# Patient Record
Sex: Male | Born: 1950 | Race: Black or African American | Hispanic: No | Marital: Single | Smoking: Current every day smoker
Health system: Southern US, Community
[De-identification: ages and names within clinical notes are randomized; demographics above are authoritative.]

## PROBLEM LIST (undated history)

## (undated) DIAGNOSIS — B1920 Unspecified viral hepatitis C without hepatic coma: Secondary | ICD-10-CM

## (undated) DIAGNOSIS — G629 Polyneuropathy, unspecified: Secondary | ICD-10-CM

## (undated) DIAGNOSIS — I10 Essential (primary) hypertension: Secondary | ICD-10-CM

## (undated) DIAGNOSIS — K7460 Unspecified cirrhosis of liver: Secondary | ICD-10-CM

## (undated) HISTORY — PX: MANDIBLE SURGERY: SHX707

## (undated) HISTORY — PX: DENTAL SURGERY: SHX609

---

## 2008-10-28 ENCOUNTER — Encounter: Admission: RE | Admit: 2008-10-28 | Discharge: 2008-10-28 | Payer: Self-pay | Admitting: Family Medicine

## 2009-06-08 ENCOUNTER — Encounter: Admission: RE | Admit: 2009-06-08 | Discharge: 2009-06-08 | Payer: Self-pay | Admitting: Family Medicine

## 2011-11-25 ENCOUNTER — Emergency Department (HOSPITAL_COMMUNITY)
Admission: EM | Admit: 2011-11-25 | Discharge: 2011-11-25 | Disposition: A | Discharge status: Home / Self Care | Payer: Medicaid Other | Attending: Emergency Medicine | Admitting: Emergency Medicine

## 2011-11-25 ENCOUNTER — Emergency Department (HOSPITAL_COMMUNITY): Payer: Medicaid Other

## 2011-11-25 ENCOUNTER — Encounter (HOSPITAL_COMMUNITY): Payer: Self-pay

## 2011-11-25 ENCOUNTER — Other Ambulatory Visit: Payer: Self-pay

## 2011-11-25 DIAGNOSIS — Z5900 Homelessness unspecified: Secondary | ICD-10-CM | POA: Insufficient documentation

## 2011-11-25 DIAGNOSIS — Z9889 Other specified postprocedural states: Secondary | ICD-10-CM | POA: Insufficient documentation

## 2011-11-25 DIAGNOSIS — R000 Tachycardia, unspecified: Secondary | ICD-10-CM | POA: Insufficient documentation

## 2011-11-25 DIAGNOSIS — K5900 Constipation, unspecified: Secondary | ICD-10-CM | POA: Insufficient documentation

## 2011-11-25 DIAGNOSIS — R5381 Other malaise: Secondary | ICD-10-CM | POA: Insufficient documentation

## 2011-11-25 DIAGNOSIS — Z79899 Other long term (current) drug therapy: Secondary | ICD-10-CM | POA: Insufficient documentation

## 2011-11-25 DIAGNOSIS — R5383 Other fatigue: Secondary | ICD-10-CM | POA: Insufficient documentation

## 2011-11-25 DIAGNOSIS — E119 Type 2 diabetes mellitus without complications: Secondary | ICD-10-CM | POA: Insufficient documentation

## 2011-11-25 DIAGNOSIS — I10 Essential (primary) hypertension: Secondary | ICD-10-CM | POA: Insufficient documentation

## 2011-11-25 DIAGNOSIS — R42 Dizziness and giddiness: Secondary | ICD-10-CM | POA: Insufficient documentation

## 2011-11-25 DIAGNOSIS — R10819 Abdominal tenderness, unspecified site: Secondary | ICD-10-CM | POA: Insufficient documentation

## 2011-11-25 DIAGNOSIS — K7460 Unspecified cirrhosis of liver: Secondary | ICD-10-CM | POA: Insufficient documentation

## 2011-11-25 DIAGNOSIS — Z8619 Personal history of other infectious and parasitic diseases: Secondary | ICD-10-CM | POA: Insufficient documentation

## 2011-11-25 DIAGNOSIS — R739 Hyperglycemia, unspecified: Secondary | ICD-10-CM

## 2011-11-25 DIAGNOSIS — Z794 Long term (current) use of insulin: Secondary | ICD-10-CM | POA: Insufficient documentation

## 2011-11-25 DIAGNOSIS — F17200 Nicotine dependence, unspecified, uncomplicated: Secondary | ICD-10-CM | POA: Insufficient documentation

## 2011-11-25 DIAGNOSIS — Z7982 Long term (current) use of aspirin: Secondary | ICD-10-CM | POA: Insufficient documentation

## 2011-11-25 HISTORY — DX: Unspecified viral hepatitis C without hepatic coma: B19.20

## 2011-11-25 HISTORY — DX: Unspecified cirrhosis of liver: K74.60

## 2011-11-25 HISTORY — DX: Essential (primary) hypertension: I10

## 2011-11-25 LAB — DIFFERENTIAL
Basophils Absolute: 0 10*3/uL (ref 0.0–0.1)
Eosinophils Relative: 0 % (ref 0–5)
Lymphocytes Relative: 52 % — ABNORMAL HIGH (ref 12–46)
Lymphs Abs: 2 10*3/uL (ref 0.7–4.0)
Monocytes Absolute: 0.2 10*3/uL (ref 0.1–1.0)
Neutrophils Relative %: 42 % — ABNORMAL LOW (ref 43–77)

## 2011-11-25 LAB — CBC
MCHC: 34.6 g/dL (ref 30.0–36.0)
MCV: 89.2 fL (ref 78.0–100.0)
Platelets: 143 10*3/uL — ABNORMAL LOW (ref 150–400)
RBC: 4.89 MIL/uL (ref 4.22–5.81)

## 2011-11-25 LAB — URINALYSIS, ROUTINE W REFLEX MICROSCOPIC
Bilirubin Urine: NEGATIVE
Glucose, UA: >1000 mg/dL — AB
Leukocytes, UA: NEGATIVE
Nitrite: NEGATIVE
Protein, ur: NEGATIVE mg/dL

## 2011-11-25 LAB — GLUCOSE, CAPILLARY
Glucose-Capillary: 431 mg/dL — ABNORMAL HIGH (ref 70–99)
Glucose-Capillary: 490 mg/dL — ABNORMAL HIGH (ref 70–99)

## 2011-11-25 LAB — COMPREHENSIVE METABOLIC PANEL
Albumin: 3.5 g/dL (ref 3.5–5.2)
Chloride: 98 mEq/L (ref 96–112)
GFR calc non Af Amer: >90 mL/min (ref >90)
Glucose, Bld: 472 mg/dL — ABNORMAL HIGH (ref 70–99)
Potassium: 4.3 mEq/L (ref 3.5–5.1)
Total Protein: 7.7 g/dL (ref 6.0–8.3)

## 2011-11-25 MED ORDER — SODIUM CHLORIDE 0.9 % IV BOLUS (SEPSIS)
1000.0000 mL | Freq: Once | INTRAVENOUS | Status: AC
  Administered 2011-11-25: 1000 mL via INTRAVENOUS

## 2011-11-25 MED ORDER — HYDROMORPHONE HCL PF 1 MG/ML IJ SOLN
1.0000 mg | Freq: Once | INTRAMUSCULAR | Status: AC
  Administered 2011-11-25: 1 mg via INTRAVENOUS
  Filled 2011-11-25: qty 1

## 2011-11-25 MED ORDER — TRAMADOL HCL 50 MG PO TABS: 50.0000 mg | ORAL_TABLET | Freq: Four times a day (QID) | ORAL | Status: AC | PRN

## 2011-11-25 MED ORDER — METOCLOPRAMIDE HCL 5 MG/ML IJ SOLN
10.0000 mg | Freq: Once | INTRAMUSCULAR | Status: AC
  Administered 2011-11-25: 10 mg via INTRAVENOUS
  Filled 2011-11-25: qty 2

## 2011-11-25 MED ORDER — IOHEXOL 300 MG/ML  SOLN
100.0000 mL | Freq: Once | INTRAMUSCULAR | Status: AC | PRN
  Administered 2011-11-25: 100 mL via INTRAVENOUS

## 2011-11-25 MED ORDER — IOHEXOL 300 MG/ML  SOLN
40.0000 mL | Freq: Once | INTRAMUSCULAR | Status: AC | PRN
  Administered 2011-11-25: 40 mL via ORAL

## 2011-11-25 MED ORDER — POLYETHYLENE GLYCOL 3350 17 GM/SCOOP PO POWD: 17.0000 g | Freq: Two times a day (BID) | ORAL | Status: AC

## 2011-11-25 MED ORDER — METOCLOPRAMIDE HCL 10 MG PO TABS: 10.0000 mg | ORAL_TABLET | Freq: Four times a day (QID) | ORAL | Status: DC

## 2011-11-25 MED ORDER — ONDANSETRON HCL 4 MG/2ML IJ SOLN
4.0000 mg | Freq: Once | INTRAMUSCULAR | Status: AC
  Administered 2011-11-25: 4 mg via INTRAVENOUS
  Filled 2011-11-25: qty 2

## 2011-11-25 MED ORDER — INSULIN ASPART 100 UNIT/ML ~~LOC~~ SOLN: 15.0000 [IU] | Freq: Three times a day (TID) | SUBCUTANEOUS | Status: DC

## 2011-11-25 MED ORDER — "INSULIN SYRINGE-NEEDLE U-100 26G X 1/2"" 1 ML MISC": 1.0000 | Freq: Three times a day (TID) | Status: DC

## 2011-11-25 MED ORDER — INSULIN ASPART 100 UNIT/ML ~~LOC~~ SOLN
15.0000 [IU] | Freq: Once | SUBCUTANEOUS | Status: AC
  Administered 2011-11-25: 15 [IU] via SUBCUTANEOUS
  Filled 2011-11-25: qty 1

## 2011-11-25 NOTE — ED Notes (Signed)
In to recheck sugar-255. Pt had vomited up contrast in floor. Area cleaned and edp aware

## 2011-11-25 NOTE — ED Provider Notes (Signed)
History     CSN: [id number]  Arrival date & time 11/25/11  1210   First MD Initiated Contact with Patient 11/25/11 1253      Chief Complaint  Patient presents with  . Hyperglycemia    (Consider location/radiation/quality/duration/timing/severity/associated sxs/prior treatment) HPI Comments: 61 year old male history of multiple medical problems including hypertension and diabetes presents with generalized malaise, fatigue and "not feeling well." Patient states he has been homeless and without medication since August. He is not taking any insulin since August. Unsure of what his blood sugar is. His had loose stool with no hematochezia or melena. Nausea but no vomiting. Has been trying to establish care with a primary care physician but has been unsuccessful. He denies chest pain, shortness of breath, palpitations. He has generalized abdominal pain.  The history is provided by the patient. No language interpreter was used.    Past Medical History  Diagnosis Date  . Diabetes mellitus   . HTN (hypertension)   . Cirrhosis of liver   . Hepatitis C     Past Surgical History  Procedure Date  . Mandible surgery     No family history on file.  History  Substance Use Topics  . Smoking status: Current Everyday Smoker  . Smokeless tobacco: Not on file  . Alcohol Use: 1.8 oz/week    3 Cans of beer per week      Review of Systems  Constitutional: Negative for fever, chills and fatigue.  HENT: Negative for congestion, sore throat, rhinorrhea, neck pain and neck stiffness.   Eyes: Negative for photophobia and visual disturbance.  Respiratory: Negative for cough and shortness of breath.   Cardiovascular: Negative for chest pain and palpitations.  Gastrointestinal: Negative for nausea, vomiting and abdominal pain.  Genitourinary: Negative for dysuria, urgency, frequency and flank pain.  Musculoskeletal: Negative for myalgias, back pain and arthralgias.  Neurological: Positive for  dizziness and weakness (generalized). Negative for light-headedness, numbness and headaches.  All other systems reviewed and are negative.    Allergies  Review of patient's allergies indicates no known allergies.  Home Medications   Current Outpatient Rx  Name Route Sig Dispense Refill  . ASPIRIN 325 MG PO TABS Oral Take 325 mg by mouth daily as needed. For pain    . INSULIN ASPART 100 UNIT/ML [place] SOLN Subcutaneous Inject 15 Units into the skin 3 (three) times daily before meals. 1 vial 12  . INSULIN SYRINGE-NEEDLE U-100 26G X 1/2" 1 ML MISC Does not apply 1 applicator by Does not apply route 3 (three) times daily. 1 each 1  . METOCLOPRAMIDE HCL 10 MG PO TABS Oral Take 1 tablet (10 mg total) by mouth every 6 (six) hours. 30 tablet 1  . POLYETHYLENE GLYCOL 3350 PO POWD Oral Take 17 g by mouth 2 (two) times daily. 255 g 0  . TRAMADOL HCL 50 MG PO TABS Oral Take 1 tablet (50 mg total) by mouth every 6 (six) hours as needed for pain. 15 tablet 0    BP 178/97  Pulse 76  Temp(Src) 97.8 F (36.6 C) (Oral)  Resp 20  Ht 5\' 11"  (1.803 m)  Wt 170 lb (77.111 kg)  BMI 23.71 kg/m2  SpO2 98%  Physical Exam  Nursing note and vitals reviewed. Constitutional: He is oriented to person, place, and time. He appears well-developed and well-nourished. No distress.  HENT:  Head: Normocephalic and atraumatic.  Mouth/Throat: Oropharynx is clear and moist.  Eyes: Conjunctivae and EOM are normal. Pupils are equal, round,  and reactive to light.  Neck: Normal range of motion. Neck supple.  Cardiovascular: Regular rhythm, normal heart sounds and intact distal pulses.  Exam reveals no gallop and no friction rub.   No murmur heard.      Tachycardic rate  Pulmonary/Chest: Effort normal and breath sounds normal. No respiratory distress. He exhibits no tenderness.  Abdominal: Soft. Bowel sounds are normal. There is tenderness (diffuse). There is no rebound and no guarding.  Musculoskeletal: Normal range of  motion. He exhibits no edema and no tenderness.  Neurological: He is alert and oriented to person, place, and time. No cranial nerve deficit.  Skin: Skin is warm and dry. No rash noted.    ED Course  Procedures (including critical care time)   Date: 11/25/2011  Rate: 74  Rhythm: normal sinus rhythm  QRS Axis: normal  Intervals: normal  ST/T Wave abnormalities: normal  Conduction Disutrbances:none  Narrative Interpretation:   Old EKG Reviewed: none available  Labs Reviewed  GLUCOSE, CAPILLARY - Abnormal; Notable for the following:    Glucose-Capillary 490 (*)    All other components within normal limits  GLUCOSE, CAPILLARY - Abnormal; Notable for the following:    Glucose-Capillary 431 (*)    All other components within normal limits  CBC - Abnormal; Notable for the following:    WBC 3.9 (*)    Platelets 143 (*)    All other components within normal limits  DIFFERENTIAL - Abnormal; Notable for the following:    Neutrophils Relative 42 (*)    Neutro Abs 1.6 (*)    Lymphocytes Relative 52 (*)    All other components within normal limits  COMPREHENSIVE METABOLIC PANEL - Abnormal; Notable for the following:    Glucose, Bld 472 (*)    AST 147 (*)    ALT 110 (*)    All other components within normal limits  URINALYSIS, ROUTINE W REFLEX MICROSCOPIC - Abnormal; Notable for the following:    Specific Gravity, Urine <1.005 (*)    Glucose, UA >1000 (*)    All other components within normal limits  GLUCOSE, CAPILLARY - Abnormal; Notable for the following:    Glucose-Capillary 219 (*)    All other components within normal limits  AMMONIA  URINE MICROSCOPIC-ADD ON   Dg Chest 2 View  11/25/2011  *RADIOLOGY REPORT*  Clinical Data: Malaise with hypertension.  Diabetes cirrhosis and hepatitis C  CHEST - 2 VIEW  Comparison: None.  Findings: Heart and mediastinal contours are within normal limits. The lung fields appear clear with no signs of focal infiltrate or congestive failure.  No  pleural fluid or significant peribronchial cuffing is seen.  Bony structures appear intact.  IMPRESSION: No worrisome focal or acute cardiopulmonary abnormality noted.  Original Report Authenticated By: Bertha Stakes, M.D.   Ct Abdomen Pelvis W Contrast  11/25/2011  *RADIOLOGY REPORT*  Clinical Data: Diffuse abdominal pain.  Hypertension.  Cirrhosis. Hepatitis C.  CT ABDOMEN AND PELVIS WITH CONTRAST  Technique:  Multidetector CT imaging of the abdomen and pelvis was performed following the standard protocol during bolus administration of intravenous contrast.  Contrast: OMNIPAQUE IOHEXOL 300 MG/ML IJ SOLN  Comparison: 06/08/2009  Findings: Left base scarring.  Normal heart size without pericardial or pleural effusion.  Mild esophageal dilatation with contrast within.  Example image 1.  Moderate hepatic steatosis.  There is also subtle cirrhosis, with enlargement of the caudate lobe and atrophy of the medial segment left lobe.  No focal liver lesion.  Portal veins  and hepatic veins are patent.  Splenule.  Stomach is contrast filled and mildly prominent.  The descending duodenum is normal to decompressed in caliber.  No obstructive process is seen.  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 Diffuse main pancreatic duct dilatation with pancreatic atrophy. Duct measures up to 1.7 cm in the region of the neck, unchanged. There are confluent focal calcifications in the head, uncinate process, which are unchanged.  No definite peripancreatic edema.  Numerous small gallstones without gallbladder wall thickening or pericholecystic fluid.  No biliary ductal dilatation.  Normal adrenal glands.  Right renal collecting systems stones. Normal left kidney.  Aortic atherosclerosis. No retroperitoneal or retrocrural adenopathy.  Colonic stool burden suggests constipation.  The proximal sigmoid appears thick-walled on image 69.  The rectosigmoid junction appears thick-walled on image 65.  These  areas may be both secondary to underdistension.  Normal terminal ileum and appendix.  Normal small bowel without abdominal ascites.    No pelvic adenopathy.     No evidence of portal venous hypertension.  No pelvic adenopathy.    Normal urinary bladder and prostate.  No significant free fluid.  No acute osseous abnormality.  Prominent disc bulge including at L2-L3.  IMPRESSION:  1.  Mild cirrhosis, without evidence of hepatocellular carcinoma or portal venous hypertension. 2.  Findings of chronic calcific pancreatitis, similar to on the prior.  No convincing evidence of acute superimposed pancreatitis. 3.  Cholelithiasis. 4. Esophageal air fluid level suggests dysmotility or gastroesophageal reflux. 5.  Apparent mild gastric distention, with relative normal caliber of the duodenum.  No definite obstructive mass is identified.  If gastric outlet obstruction is a clinical concern, non emergent upper GI or endoscopy should be considered. 6. Possible constipation. 7.  Areas of apparent wall thickening within the sigmoid and rectosigmoid colon.  Possibly secondary to underdistension.  If this 61 year old patient has not undergone a screening colonoscopy recently, this should be considered as an outpatient. 8.  Right renal calculi.  Original Report Authenticated By: Consuello Bossier, M.D.     1. Hyperglycemia   2. Constipation       MDM  Hyperglycemia and constipation. Also has mild cirrhosis which he was aware. He's not been taking any of his medications for the past several months. He is homeless. He does have insurance. He received 2 L of fluids, subcutaneous insulin. The remainder of his workup was relatively unremarkable except for slight elevation of liver enzymes consistent with a cirrhosis. He had improvement of his blood sugar after the interventions. He states he is feeling better. He was given Reglan for some gastroparesis seen on CT. He is prescribed Reglan, insulin, MiraLAX for constipation.  Instructed to followup with her primary care physician.        Dayton Bailiff, MD 11/25/11 [phone]

## 2011-11-25 NOTE — Discharge Instructions (Signed)
Hyperglycemia  Hyperglycemia occurs when the glucose (sugar) in your blood is too high. Hyperglycemia can happen for many reasons, but it most often happens to people who do not know they have diabetes or are not managing their diabetes properly.   CAUSES   Whether you have diabetes or not, there are other causes of hyperglycemia. Hyperglycemia can occur when you have diabetes, but it can also occur in other situations that you might not be as aware of, such as:  Diabetes  · If you have diabetes and are having problems controlling your blood glucose, hyperglycemia could occur because of some of the following reasons:  · Not following your meal plan.  · Not taking your diabetes medications or not taking it properly.  · Exercising less or doing less activity than you normally do.  · Being sick.  Pre-diabetes  · This cannot be ignored. Before people develop Type 2 diabetes, they almost always have "pre-diabetes." This is when your blood glucose levels are higher than normal, but not yet high enough to be diagnosed as diabetes. Research has shown that some long-term damage to the body, especially the heart and circulatory system, may already be occurring during pre-diabetes. If you take action to manage your blood glucose when you have pre-diabetes, you may delay or prevent Type 2 diabetes from developing.  Stress  · If you have diabetes, you may be "diet" controlled or on oral medications or insulin to control your diabetes. However, you may find that your blood glucose is higher than usual in the hospital whether you have diabetes or not. This is often referred to as "stress hyperglycemia." Stress can elevate your blood glucose. This happens because of hormones put out by the body during times of stress. If stress has been the cause of your high blood glucose, it can be followed regularly by your caregiver. That way he/she can make sure your hyperglycemia does not continue to get worse or progress to  diabetes.  Steroids  · Steroids are medications that act on the infection fighting system (immune system) to block inflammation or infection. One side effect can be a rise in blood glucose. Most people can produce enough extra insulin to allow for this rise, but for those who cannot, steroids make blood glucose levels go even higher. It is not unusual for steroid treatments to "uncover" diabetes that is developing. It is not always possible to determine if the hyperglycemia will go away after the steroids are stopped. A special blood test called an A1c is sometimes done to determine if your blood glucose was elevated before the steroids were started.  SYMPTOMS  · Thirsty.  · Frequent urination.  · Dry mouth.  · Blurred vision.  · Tired or fatigue.  · Weakness.  · Sleepy.  · Tingling in feet or leg.  DIAGNOSIS   Diagnosis is made by monitoring blood glucose in one or all of the following ways:  · A1c test. This is a chemical found in your blood.  · Fingerstick blood glucose monitoring.  · Laboratory results.  TREATMENT   First, knowing the cause of the hyperglycemia is important before the hyperglycemia can be treated. Treatment may include, but is not be limited to:  · Education.  · Change or adjustment in medications.  · Change or adjustment in meal plan.  · Treatment for an illness, infection, etc.  · More frequent blood glucose monitoring.  · Change in exercise plan.  · Decreasing or stopping steroids.  ·   Lifestyle changes.  HOME CARE INSTRUCTIONS   · Test your blood glucose as directed.  · Exercise regularly. Your caregiver will give you instructions about exercise. Pre-diabetes or diabetes which comes on with stress is helped by exercising.  · Eat wholesome, balanced meals. Eat often and at regular, fixed times. Your caregiver or nutritionist will give you a meal plan to guide your sugar intake.  · Being at an ideal weight is important. If needed, losing as little as 10 to 15 pounds may help improve blood  glucose levels.  SEEK MEDICAL CARE IF:   · You have questions about medicine, activity, or diet.  · You continue to have symptoms (problems such as increased thirst, urination, or weight gain).  SEEK IMMEDIATE MEDICAL CARE IF:   · You are vomiting or have diarrhea.  · Your breath smells fruity.  · You are breathing faster or slower.  · You are very sleepy or incoherent.  · You have numbness, tingling, or pain in your feet or hands.  · You have chest pain.  · Your symptoms get worse even though you have been following your caregiver's orders.  · If you have any other questions or concerns.  Document Released: 02/08/2001 Document Revised: 08/04/2011 Document Reviewed: 04/06/2009  [employer]® Patient Information ©2012 [employer].

## 2011-11-25 NOTE — ED Notes (Signed)
Pt c/o high blood sugar since not having insulin since August per pt. C/o abd pain.

## 2011-11-28 LAB — GLUCOSE, CAPILLARY: Glucose-Capillary: 255 mg/dL — ABNORMAL HIGH (ref 70–99)

## 2012-03-21 DIAGNOSIS — I10 Essential (primary) hypertension: Secondary | ICD-10-CM

## 2013-02-25 ENCOUNTER — Emergency Department: Payer: Self-pay | Admitting: Emergency Medicine

## 2013-02-25 LAB — COMPREHENSIVE METABOLIC PANEL
Alkaline Phosphatase: 129 U/L (ref 50–136)
Bilirubin,Total: 0.5 mg/dL (ref 0.2–1.0)
Calcium, Total: 8.7 mg/dL (ref 8.5–10.1)
Chloride: 104 mmol/L (ref 98–107)
Co2: 30 mmol/L (ref 21–32)
EGFR (African American): >60
EGFR (Non-African Amer.): >60
Osmolality: 283 (ref 275–301)
SGOT(AST): 111 U/L — ABNORMAL HIGH (ref 15–37)
SGPT (ALT): 193 U/L — ABNORMAL HIGH (ref 12–78)
Total Protein: 7.1 g/dL (ref 6.4–8.2)

## 2013-02-25 LAB — TROPONIN I: Troponin-I: <0.02 ng/mL

## 2013-02-25 LAB — CBC
MCHC: 33.4 g/dL (ref 32.0–36.0)
Platelet: 132 10*3/uL — ABNORMAL LOW (ref 150–440)

## 2013-02-25 LAB — PROTIME-INR
INR: 1
Prothrombin Time: 13.7 secs (ref 11.5–14.7)

## 2014-05-07 ENCOUNTER — Emergency Department: Payer: Self-pay | Admitting: Emergency Medicine

## 2014-05-07 LAB — URINALYSIS, COMPLETE
BLOOD: NEGATIVE
Bacteria: NONE SEEN
Bilirubin,UR: NEGATIVE
Glucose,UR: >=500 mg/dL (ref 0–75)
Ketone: NEGATIVE
LEUKOCYTE ESTERASE: NEGATIVE
NITRITE: NEGATIVE
PH: 5 (ref 4.5–8.0)
PROTEIN: NEGATIVE
RBC,UR: NONE SEEN /HPF (ref 0–5)
Specific Gravity: 1.002 (ref 1.003–1.030)

## 2014-05-07 LAB — CBC WITH DIFFERENTIAL/PLATELET
Basophil #: 0 10*3/uL (ref 0.0–0.1)
Basophil %: 0.6 %
Eosinophil #: 0 10*3/uL (ref 0.0–0.7)
Eosinophil %: 0.4 %
HCT: 40.6 % (ref 40.0–52.0)
HGB: 13.1 g/dL (ref 13.0–18.0)
LYMPHS ABS: 2.3 10*3/uL (ref 1.0–3.6)
Lymphocyte %: 56.4 %
MCH: 31.7 pg (ref 26.0–34.0)
MCHC: 32.3 g/dL (ref 32.0–36.0)
MCV: 98 fL (ref 80–100)
Monocyte #: 0.2 x10 3/mm (ref 0.2–1.0)
Monocyte %: 5.3 %
NEUTROS ABS: 1.5 10*3/uL (ref 1.4–6.5)
NEUTROS PCT: 37.3 %
Platelet: 104 10*3/uL — ABNORMAL LOW (ref 150–440)
RBC: 4.13 10*6/uL — AB (ref 4.40–5.90)
RDW: 15.1 % — AB (ref 11.5–14.5)
WBC: 4 10*3/uL (ref 3.8–10.6)

## 2014-05-07 LAB — COMPREHENSIVE METABOLIC PANEL
ALT: 210 U/L — AB
ANION GAP: 10 (ref 7–16)
Albumin: 2.9 g/dL — ABNORMAL LOW (ref 3.4–5.0)
Alkaline Phosphatase: 114 U/L
BILIRUBIN TOTAL: 0.7 mg/dL (ref 0.2–1.0)
BUN: 5 mg/dL — ABNORMAL LOW (ref 7–18)
CALCIUM: 8.4 mg/dL — AB (ref 8.5–10.1)
CHLORIDE: 100 mmol/L (ref 98–107)
Co2: 25 mmol/L (ref 21–32)
Creatinine: 0.77 mg/dL (ref 0.60–1.30)
GLUCOSE: 260 mg/dL — AB (ref 65–99)
Osmolality: 276 (ref 275–301)
POTASSIUM: 3.9 mmol/L (ref 3.5–5.1)
SGOT(AST): 220 U/L — ABNORMAL HIGH (ref 15–37)
SODIUM: 135 mmol/L — AB (ref 136–145)
Total Protein: 7 g/dL (ref 6.4–8.2)

## 2014-05-07 LAB — PROTIME-INR
INR: 1.1
PROTHROMBIN TIME: 14.1 s (ref 11.5–14.7)

## 2014-05-07 LAB — LIPASE, BLOOD: Lipase: 45 U/L — ABNORMAL LOW (ref 73–393)

## 2014-06-19 ENCOUNTER — Ambulatory Visit: Payer: Self-pay | Admitting: Gastroenterology

## 2014-06-23 ENCOUNTER — Ambulatory Visit: Payer: Self-pay | Admitting: Gastroenterology

## 2014-06-23 ENCOUNTER — Ambulatory Visit: Payer: Self-pay | Admitting: Neurology

## 2014-08-06 ENCOUNTER — Emergency Department: Payer: Self-pay | Admitting: Emergency Medicine

## 2014-08-06 LAB — COMPREHENSIVE METABOLIC PANEL
ANION GAP: 5 — AB (ref 7–16)
Albumin: 2.9 g/dL — ABNORMAL LOW (ref 3.4–5.0)
Alkaline Phosphatase: 139 U/L — ABNORMAL HIGH
BUN: 7 mg/dL (ref 7–18)
Bilirubin,Total: 1 mg/dL (ref 0.2–1.0)
CALCIUM: 8.1 mg/dL — AB (ref 8.5–10.1)
CREATININE: 1.07 mg/dL (ref 0.60–1.30)
Chloride: 97 mmol/L — ABNORMAL LOW (ref 98–107)
Co2: 31 mmol/L (ref 21–32)
EGFR (Non-African Amer.): >60
GLUCOSE: 445 mg/dL — AB (ref 65–99)
Osmolality: 284 (ref 275–301)
Potassium: 3.6 mmol/L (ref 3.5–5.1)
SGOT(AST): 86 U/L — ABNORMAL HIGH (ref 15–37)
SGPT (ALT): 92 U/L — ABNORMAL HIGH
Sodium: 133 mmol/L — ABNORMAL LOW (ref 136–145)
TOTAL PROTEIN: 7.2 g/dL (ref 6.4–8.2)

## 2014-08-06 LAB — URINALYSIS, COMPLETE
Bacteria: NONE SEEN
Bilirubin,UR: NEGATIVE
Blood: NEGATIVE
Glucose,UR: >=500 mg/dL (ref 0–75)
Ketone: NEGATIVE
Leukocyte Esterase: NEGATIVE
Nitrite: NEGATIVE
PROTEIN: NEGATIVE
Ph: 6 (ref 4.5–8.0)
RBC,UR: 1 /HPF (ref 0–5)
SPECIFIC GRAVITY: 1.021 (ref 1.003–1.030)
Squamous Epithelial: <1
WBC UR: NONE SEEN /HPF (ref 0–5)

## 2014-08-06 LAB — CBC
HCT: 38.6 % — ABNORMAL LOW (ref 40.0–52.0)
HGB: 12.6 g/dL — ABNORMAL LOW (ref 13.0–18.0)
MCH: 32.6 pg (ref 26.0–34.0)
MCHC: 32.7 g/dL (ref 32.0–36.0)
MCV: 100 fL (ref 80–100)
PLATELETS: 88 10*3/uL — AB (ref 150–440)
RBC: 3.87 10*6/uL — ABNORMAL LOW (ref 4.40–5.90)
RDW: 14.2 % (ref 11.5–14.5)
WBC: 3.9 10*3/uL (ref 3.8–10.6)

## 2014-08-06 LAB — LIPASE, BLOOD: LIPASE: 40 U/L — AB (ref 73–393)

## 2014-09-23 ENCOUNTER — Ambulatory Visit: Payer: Self-pay | Admitting: Podiatry

## 2014-09-30 ENCOUNTER — Ambulatory Visit: Payer: Self-pay | Admitting: Podiatry

## 2014-10-16 ENCOUNTER — Ambulatory Visit: Payer: Self-pay | Admitting: Podiatry

## 2014-12-22 LAB — SURGICAL PATHOLOGY

## 2015-02-05 ENCOUNTER — Emergency Department
Admission: EM | Admit: 2015-02-05 | Discharge: 2015-02-05 | Disposition: A | Discharge status: Home / Self Care | Payer: Medicaid Other | Attending: Emergency Medicine | Admitting: Emergency Medicine

## 2015-02-05 ENCOUNTER — Other Ambulatory Visit: Payer: Self-pay

## 2015-02-05 ENCOUNTER — Encounter: Payer: Self-pay | Admitting: Emergency Medicine

## 2015-02-05 DIAGNOSIS — R739 Hyperglycemia, unspecified: Secondary | ICD-10-CM

## 2015-02-05 DIAGNOSIS — I10 Essential (primary) hypertension: Secondary | ICD-10-CM | POA: Diagnosis present

## 2015-02-05 DIAGNOSIS — Z794 Long term (current) use of insulin: Secondary | ICD-10-CM | POA: Diagnosis not present

## 2015-02-05 DIAGNOSIS — M79604 Pain in right leg: Secondary | ICD-10-CM | POA: Diagnosis not present

## 2015-02-05 DIAGNOSIS — M79605 Pain in left leg: Secondary | ICD-10-CM | POA: Diagnosis not present

## 2015-02-05 DIAGNOSIS — Z720 Tobacco use: Secondary | ICD-10-CM | POA: Insufficient documentation

## 2015-02-05 DIAGNOSIS — E1165 Type 2 diabetes mellitus with hyperglycemia: Secondary | ICD-10-CM | POA: Insufficient documentation

## 2015-02-05 DIAGNOSIS — I159 Secondary hypertension, unspecified: Secondary | ICD-10-CM | POA: Insufficient documentation

## 2015-02-05 HISTORY — DX: Polyneuropathy, unspecified: G62.9

## 2015-02-05 LAB — CBC WITH DIFFERENTIAL/PLATELET
BASOS ABS: 0.1 10*3/uL (ref 0–0.1)
Basophils Relative: 2 %
EOS ABS: 0 10*3/uL (ref 0–0.7)
Eosinophils Relative: 0 %
HEMATOCRIT: 43.4 % (ref 40.0–52.0)
HEMOGLOBIN: 14.2 g/dL (ref 13.0–18.0)
LYMPHS ABS: 1.5 10*3/uL (ref 1.0–3.6)
LYMPHS PCT: 36 %
MCH: 31.9 pg (ref 26.0–34.0)
MCHC: 32.7 g/dL (ref 32.0–36.0)
MCV: 97.4 fL (ref 80.0–100.0)
Monocytes Absolute: 0.3 10*3/uL (ref 0.2–1.0)
Monocytes Relative: 6 %
NEUTROS ABS: 2.3 10*3/uL (ref 1.4–6.5)
NEUTROS PCT: 56 %
PLATELETS: 102 10*3/uL — AB (ref 150–440)
RBC: 4.45 MIL/uL (ref 4.40–5.90)
RDW: 12.8 % (ref 11.5–14.5)
WBC: 4.1 10*3/uL (ref 3.8–10.6)

## 2015-02-05 LAB — URINALYSIS COMPLETE WITH MICROSCOPIC (ARMC ONLY)
BILIRUBIN URINE: NEGATIVE
Bacteria, UA: NONE SEEN
HGB URINE DIPSTICK: NEGATIVE
KETONES UR: NEGATIVE mg/dL
LEUKOCYTES UA: NEGATIVE
NITRITE: NEGATIVE
PROTEIN: NEGATIVE mg/dL
SPECIFIC GRAVITY, URINE: 1.028 (ref 1.005–1.030)
WBC UA: NONE SEEN WBC/hpf (ref 0–5)
pH: 6 (ref 5.0–8.0)

## 2015-02-05 LAB — COMPREHENSIVE METABOLIC PANEL
ALK PHOS: 120 U/L (ref 38–126)
ALT: 137 U/L — AB (ref 17–63)
ANION GAP: 7 (ref 5–15)
AST: 108 U/L — AB (ref 15–41)
Albumin: 4 g/dL (ref 3.5–5.0)
BILIRUBIN TOTAL: 1.1 mg/dL (ref 0.3–1.2)
BUN: 7 mg/dL (ref 6–20)
CO2: 30 mmol/L (ref 22–32)
Calcium: 9.2 mg/dL (ref 8.9–10.3)
Chloride: 97 mmol/L — ABNORMAL LOW (ref 101–111)
Creatinine, Ser: 1.09 mg/dL (ref 0.61–1.24)
GFR calc non Af Amer: >60 mL/min (ref >60)
GLUCOSE: 536 mg/dL — AB (ref 65–99)
POTASSIUM: 4.4 mmol/L (ref 3.5–5.1)
SODIUM: 134 mmol/L — AB (ref 135–145)
Total Protein: 8.1 g/dL (ref 6.5–8.1)

## 2015-02-05 LAB — GLUCOSE, CAPILLARY
GLUCOSE-CAPILLARY: 520 mg/dL — AB (ref 65–99)
GLUCOSE-CAPILLARY: 372 mg/dL — AB (ref 65–99)
Glucose-Capillary: 522 mg/dL — ABNORMAL HIGH (ref 65–99)

## 2015-02-05 LAB — TROPONIN I

## 2015-02-05 MED ORDER — INSULIN ASPART 100 UNIT/ML ~~LOC~~ SOLN
6.0000 [IU] | Freq: Once | SUBCUTANEOUS | Status: AC
  Administered 2015-02-05: 6 [IU] via INTRAVENOUS

## 2015-02-05 MED ORDER — INSULIN ASPART 100 UNIT/ML ~~LOC~~ SOLN
SUBCUTANEOUS | Status: AC
Start: 2015-02-05 — End: 2015-02-05
  Administered 2015-02-05: 6 [IU] via INTRAVENOUS
  Filled 2015-02-05: qty 6

## 2015-02-05 MED ORDER — IBUPROFEN 600 MG PO TABS: 600.0000 mg | ORAL_TABLET | Freq: Once | ORAL | Status: DC

## 2015-02-05 MED ORDER — SODIUM CHLORIDE 0.9 % IV BOLUS (SEPSIS)
1000.0000 mL | Freq: Once | INTRAVENOUS | Status: AC
  Administered 2015-02-05: 1000 mL via INTRAVENOUS

## 2015-02-05 MED ORDER — IBUPROFEN 600 MG PO TABS
ORAL_TABLET | ORAL | Status: AC
  Filled 2015-02-05: qty 1

## 2015-02-05 NOTE — ED Provider Notes (Signed)
[facility] Emergency Department Provider Note    ____________________________________________  Time seen: 1200  I have reviewed the triage vital signs and the nursing notes.   HISTORY  Chief Complaint Hyperglycemia; Hypertension; Abdominal Pain; and Foot Pain   History limited by: Not Limited   HPI Ian [name] is a 64 y.o. male who presents to the emergency department today because of concerns for high blood sugar, high blood pressure and leg pain. Patient states the symptoms been going on for quite a while. It appears that he means weeks perhaps months. He states he has not been taking his insulin. He states this because of financial reasons. His leg pain has also been severe. They state that the primary care doctor for medication which did not seem to be helping. He denies any fevers, persistent vomiting and diarrhea.  Past Medical History  Diagnosis Date  . Diabetes mellitus   . HTN (hypertension)   . Cirrhosis of liver   . Hepatitis C   . Neuropathy   . Cirrhosis   . Hepatitis C     There are no active problems to display for this patient.   Past Surgical History  Procedure Laterality Date  . Mandible surgery    . Dental surgery      teeth extraction    Current Outpatient Rx  Name  Route  Sig  Dispense  Refill  . aspirin 325 MG tablet   Oral   Take 325 mg by mouth daily as needed. For pain         . EXPIRED: insulin aspart (NOVOLOG) 100 UNIT/ML injection   Subcutaneous   Inject 15 Units into the skin 3 (three) times daily before meals.   1 vial   12   . Insulin Syringe-Needle U-100 26G X 1/2" 1 ML MISC   Does not apply   1 applicator by Does not apply route 3 (three) times daily.   1 each   1   . EXPIRED: metoCLOPramide (REGLAN) 10 MG tablet   Oral   Take 1 tablet (10 mg total) by mouth every 6 (six) hours.   30 tablet   1     Allergies Review of patient's allergies indicates no known allergies.  No family  history on file.  Social History History  Substance Use Topics  . Smoking status: Current Every Day Smoker    Types: Cigarettes  . Smokeless tobacco: Never Used  . Alcohol Use: 1.8 oz/week    3 Cans of beer per week    Review of Systems  Constitutional: Negative for fever. Cardiovascular: Negative for chest pain. Respiratory: Negative for shortness of breath. Gastrointestinal: Abdominal pain Genitourinary: Negative for dysuria. Musculoskeletal: Bilateral lower extremity pain. Skin: Negative for rash. Neurological: Negative for headaches, focal weakness or numbness.   10-point ROS otherwise negative.  ____________________________________________   PHYSICAL EXAM:  VITAL SIGNS: ED Triage Vitals  Enc Vitals Group     BP 02/05/15 1034 162/101 mmHg     Pulse Rate 02/05/15 1034 70     Resp 02/05/15 1034 18     Temp 02/05/15 1034 97.7 F (36.5 C)     Temp Source 02/05/15 1034 Oral     SpO2 02/05/15 1034 100 %     Weight 02/05/15 1034 100 lb (45.36 kg)     Height 02/05/15 1034 5\' 10"  (1.778 m)     Head Cir --      Peak Flow --      Pain  Score 02/05/15 1034 10   Constitutional: Alert and oriented. Well appearing and in no distress. Eyes: Conjunctivae are normal. PERRL. Normal extraocular movements. ENT   Head: Normocephalic and atraumatic.   Nose: No congestion/rhinnorhea.   Mouth/Throat: Mucous membranes are moist.   Neck: No stridor. Hematological/Lymphatic/Immunilogical: No cervical lymphadenopathy. Cardiovascular: Normal rate, regular rhythm.  No murmurs, rubs, or gallops. Respiratory: Normal respiratory effort without tachypnea nor retractions. Breath sounds are clear and equal bilaterally. No wheezes/rales/rhonchi. Gastrointestinal: Soft and nontender. No distention.  Genitourinary: Deferred Musculoskeletal: Normal range of motion in all extremities. No joint effusions.  No lower extremity tenderness nor edema. Neurologic:  Normal speech and  language. No gross focal neurologic deficits are appreciated. Speech is normal.  Skin:  Skin is warm, dry and intact. No rash noted. Psychiatric: Mood and affect are normal. Speech and behavior are normal. Patient exhibits appropriate insight and judgment.  ____________________________________________    LABS (pertinent positives/negatives)  Labs Reviewed  CBC WITH DIFFERENTIAL/PLATELET - Abnormal; Notable for the following:    Platelets 102 (*)    All other components within normal limits  COMPREHENSIVE METABOLIC PANEL - Abnormal; Notable for the following:    Sodium 134 (*)    Chloride 97 (*)    Glucose, Bld 536 (*)    AST 108 (*)    ALT 137 (*)    All other components within normal limits  URINALYSIS COMPLETEWITH MICROSCOPIC ([facility] ONLY) - Abnormal; Notable for the following:    Color, Urine STRAW (*)    APPearance CLEAR (*)    Glucose, UA >500 (*)    Squamous Epithelial / LPF 0-5 (*)    All other components within normal limits  GLUCOSE, CAPILLARY - Abnormal; Notable for the following:    Glucose-Capillary 520 (*)    All other components within normal limits  GLUCOSE, CAPILLARY - Abnormal; Notable for the following:    Glucose-Capillary 522 (*)    All other components within normal limits  GLUCOSE, CAPILLARY - Abnormal; Notable for the following:    Glucose-Capillary 372 (*)    All other components within normal limits  TROPONIN I     ____________________________________________   EKG  None  ____________________________________________    RADIOLOGY  None  ____________________________________________   PROCEDURES  Procedure(s) performed: None  Critical Care performed: No  ____________________________________________   INITIAL IMPRESSION / ASSESSMENT AND PLAN / ED COURSE  Pertinent labs & imaging results that were available during my care of the patient were reviewed by me and considered in my medical decision making (see chart for  details).  Patient presents with high blood pressure, hyperglycemia, bilateral leg pain as his primary complaints. On exam patient in no acute distress. Patient did have initially quite elevated blood sugar however no signs of DKA. Patient's blood sugar did get better after insulin and IV fluids. Discussed with patient and family importance that he does get his insulin and take it regularly. Discussed that with better glycemic control his other symptoms will likely improve as well. Also encourage primary care follow-up to tackle high blood sugar and hypertension issues. Patient family verbalized understanding.  ____________________________________________   FINAL CLINICAL IMPRESSION(S) / ED DIAGNOSES  Final diagnoses:  Hyperglycemia  Secondary hypertension, unspecified     Phineas Semen, MD 02/05/15 1523

## 2015-02-05 NOTE — Care Management Note (Signed)
Case Management Note  Patient Details  Name: Ian [name] MRN: [id number] Date of Birth: 1950/10/07  Subjective/Objective:    Per Dr Derrill Kay the pt. Is unable to afford his insulin, but he is listed as having Medicaid. In checking the portal,  He last got insulin in January. Contacted  Scharlene Gloss at [facility] to see if there is any help they can provide for the pt.               Action/Plan:   Expected Discharge Date:                  Expected Discharge Plan:     In-House Referral:     Discharge planning Services     Post Acute Care Choice:    Choice offered to:     DME Arranged:    DME Agency:     HH Arranged:    HH Agency:     Status of Service:     Medicare Important Message Given:    Date Medicare IM Given:    Medicare IM give by:    Date Additional Medicare IM Given:    Additional Medicare Important Message give by:     If discussed at Long Length of Stay Meetings, dates discussed:    Additional Comments:  Berna Bue, RN 02/05/2015, 12:48 PM

## 2015-02-05 NOTE — ED Notes (Signed)
Pt reports not taking medications

## 2015-02-05 NOTE — Care Management Note (Signed)
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 The pt. Should be able to get his meds once he pays the $3 co-pay.                  Action/Plan:   Expected Discharge Date:                  Expected Discharge Plan:     In-House Referral:     Discharge planning Services     Post Acute Care Choice:    Choice offered to:     DME Arranged:    DME Agency:     HH Arranged:    HH Agency:     Status of Service:     Medicare Important Message Given:    Date Medicare IM Given:    Medicare IM give by:    Date Additional Medicare IM Given:    Additional Medicare Important Message give by:     If discussed at Long Length of Stay Meetings, dates discussed:    Additional Comments:  Berna Bue, RN 02/05/2015, 12:56 PM

## 2015-02-05 NOTE — ED Notes (Signed)
Pt states he feels his bp and blood glucose are  high, has not been taking his medicine, having bilateral foot pain, and abd pain. Has hep c and has "issues with his liver" as well.

## 2015-02-05 NOTE — ED Notes (Signed)
CRITICAL VALUE ALERT  Critical value received:  552 glucose  Date of notification:  02/05/2015  Time of notification:  1210  Critical value read back:Yes.    Nurse who received alert:  Drinda Butts RN  MD notified (1st page):  yes  Time of first page:  1210  MD notified (2nd page):  Time of second page:  Responding MD:  ER MD  Time MD responded:  1210

## 2015-02-05 NOTE — Discharge Instructions (Signed)
Please seek medical attention for any high fevers, chest pain, shortness of breath, change in behavior, persistent vomiting, bloody stool or any other new or concerning symptoms. ° °Hyperglycemia °Hyperglycemia occurs when the glucose (sugar) in your blood is too high. Hyperglycemia can happen for many reasons, but it most often happens to people who do not know they have diabetes or are not managing their diabetes properly.  °CAUSES  °Whether you have diabetes or not, there are other causes of hyperglycemia. Hyperglycemia can occur when you have diabetes, but it can also occur in other situations that you might not be as aware of, such as: °Diabetes °· If you have diabetes and are having problems controlling your blood glucose, hyperglycemia could occur because of some of the following reasons: °¨ Not following your meal plan. °¨ Not taking your diabetes medications or not taking it properly. °¨ Exercising less or doing less activity than you normally do. °¨ Being sick. °Pre-diabetes °· This cannot be ignored. Before people develop Type 2 diabetes, they almost always have "pre-diabetes." This is when your blood glucose levels are higher than normal, but not yet high enough to be diagnosed as diabetes. Research has shown that some long-term damage to the body, especially the heart and circulatory system, may already be occurring during pre-diabetes. If you take action to manage your blood glucose when you have pre-diabetes, you may delay or prevent Type 2 diabetes from developing. °Stress °· If you have diabetes, you may be "diet" controlled or on oral medications or insulin to control your diabetes. However, you may find that your blood glucose is higher than usual in the hospital whether you have diabetes or not. This is often referred to as "stress hyperglycemia." Stress can elevate your blood glucose. This happens because of hormones put out by the body during times of stress. If stress has been the cause of  your high blood glucose, it can be followed regularly by your caregiver. That way he/she can make sure your hyperglycemia does not continue to get worse or progress to diabetes. °Steroids °· Steroids are medications that act on the infection fighting system (immune system) to block inflammation or infection. One side effect can be a rise in blood glucose. Most people can produce enough extra insulin to allow for this rise, but for those who cannot, steroids make blood glucose levels go even higher. It is not unusual for steroid treatments to "uncover" diabetes that is developing. It is not always possible to determine if the hyperglycemia will go away after the steroids are stopped. A special blood test called an A1c is sometimes done to determine if your blood glucose was elevated before the steroids were started. °SYMPTOMS °· Thirsty. °· Frequent urination. °· Dry mouth. °· Blurred vision. °· Tired or fatigue. °· Weakness. °· Sleepy. °· Tingling in feet or leg. °DIAGNOSIS  °Diagnosis is made by monitoring blood glucose in one or all of the following ways: °· A1c test. This is a chemical found in your blood. °· Fingerstick blood glucose monitoring. °· Laboratory results. °TREATMENT  °First, knowing the cause of the hyperglycemia is important before the hyperglycemia can be treated. Treatment may include, but is not be limited to: °· Education. °· Change or adjustment in medications. °· Change or adjustment in meal plan. °· Treatment for an illness, infection, etc. °· More frequent blood glucose monitoring. °· Change in exercise plan. °· Decreasing or stopping steroids. °· Lifestyle changes. °HOME CARE INSTRUCTIONS  °· Test your blood glucose   as directed. °· Exercise regularly. Your caregiver will give you instructions about exercise. Pre-diabetes or diabetes which comes on with stress is helped by exercising. °· Eat wholesome, balanced meals. Eat often and at regular, fixed times. Your caregiver or nutritionist  will give you a meal plan to guide your sugar intake. °· Being at an ideal weight is important. If needed, losing as little as 10 to 15 pounds may help improve blood glucose levels. °SEEK MEDICAL CARE IF:  °· You have questions about medicine, activity, or diet. °· You continue to have symptoms (problems such as increased thirst, urination, or weight gain). °SEEK IMMEDIATE MEDICAL CARE IF:  °· You are vomiting or have diarrhea. °· Your breath smells fruity. °· You are breathing faster or slower. °· You are very sleepy or incoherent. °· You have numbness, tingling, or pain in your feet or hands. °· You have chest pain. °· Your symptoms get worse even though you have been following your caregiver's orders. °· If you have any other questions or concerns. °Document Released: 02/08/2001 Document Revised: 11/07/2011 Document Reviewed: 12/12/2011 °[employer]® Patient Information ©2015 [employer]. This information is not intended to replace advice given to you by your health care provider. Make sure you discuss any questions you have with your health care provider. ° °

## 2015-09-24 ENCOUNTER — Emergency Department: Payer: Medicaid Other

## 2015-09-24 ENCOUNTER — Emergency Department
Admission: EM | Admit: 2015-09-24 | Discharge: 2015-09-24 | Discharge status: Against Medical Advice | Payer: Medicaid Other | Attending: Student | Admitting: Student

## 2015-09-24 ENCOUNTER — Encounter: Payer: Self-pay | Admitting: Emergency Medicine

## 2015-09-24 DIAGNOSIS — M6250 Muscle wasting and atrophy, not elsewhere classified, unspecified site: Secondary | ICD-10-CM | POA: Diagnosis not present

## 2015-09-24 DIAGNOSIS — F17210 Nicotine dependence, cigarettes, uncomplicated: Secondary | ICD-10-CM | POA: Insufficient documentation

## 2015-09-24 DIAGNOSIS — I161 Hypertensive emergency: Secondary | ICD-10-CM | POA: Diagnosis not present

## 2015-09-24 DIAGNOSIS — I10 Essential (primary) hypertension: Secondary | ICD-10-CM | POA: Insufficient documentation

## 2015-09-24 DIAGNOSIS — R52 Pain, unspecified: Secondary | ICD-10-CM

## 2015-09-24 DIAGNOSIS — R079 Chest pain, unspecified: Secondary | ICD-10-CM | POA: Diagnosis present

## 2015-09-24 DIAGNOSIS — R2243 Localized swelling, mass and lump, lower limb, bilateral: Secondary | ICD-10-CM | POA: Insufficient documentation

## 2015-09-24 DIAGNOSIS — E119 Type 2 diabetes mellitus without complications: Secondary | ICD-10-CM | POA: Diagnosis not present

## 2015-09-24 LAB — GLUCOSE, CAPILLARY: Glucose-Capillary: 342 mg/dL — ABNORMAL HIGH (ref 65–99)

## 2015-09-24 LAB — CBC
HCT: 37 % — ABNORMAL LOW (ref 40.0–52.0)
Hemoglobin: 12.5 g/dL — ABNORMAL LOW (ref 13.0–18.0)
MCH: 29.2 pg (ref 26.0–34.0)
MCHC: 33.7 g/dL (ref 32.0–36.0)
MCV: 86.7 fL (ref 80.0–100.0)
PLATELETS: 117 10*3/uL — AB (ref 150–440)
RBC: 4.26 MIL/uL — ABNORMAL LOW (ref 4.40–5.90)
RDW: 14.4 % (ref 11.5–14.5)
WBC: 4.2 10*3/uL (ref 3.8–10.6)

## 2015-09-24 LAB — URINALYSIS COMPLETE WITH MICROSCOPIC (ARMC ONLY)
Bacteria, UA: NONE SEEN
Bilirubin Urine: NEGATIVE
Glucose, UA: >500 mg/dL — AB
HGB URINE DIPSTICK: NEGATIVE
Ketones, ur: NEGATIVE mg/dL
LEUKOCYTES UA: NEGATIVE
Nitrite: NEGATIVE
Protein, ur: NEGATIVE mg/dL
Specific Gravity, Urine: 1.007 (ref 1.005–1.030)
Squamous Epithelial / LPF: NONE SEEN
pH: 7 (ref 5.0–8.0)

## 2015-09-24 LAB — BASIC METABOLIC PANEL
Anion gap: 8 (ref 5–15)
BUN: <5 mg/dL — ABNORMAL LOW (ref 6–20)
CALCIUM: 8.2 mg/dL — AB (ref 8.9–10.3)
CO2: 27 mmol/L (ref 22–32)
CREATININE: 0.7 mg/dL (ref 0.61–1.24)
Chloride: 100 mmol/L — ABNORMAL LOW (ref 101–111)
GFR calc non Af Amer: >60 mL/min (ref >60)
Glucose, Bld: 379 mg/dL — ABNORMAL HIGH (ref 65–99)
Potassium: 3.6 mmol/L (ref 3.5–5.1)
SODIUM: 135 mmol/L (ref 135–145)

## 2015-09-24 LAB — TROPONIN I: Troponin I: 0.05 ng/mL — ABNORMAL HIGH (ref <0.031)

## 2015-09-24 MED ORDER — SODIUM CHLORIDE 0.9 % IV BOLUS (SEPSIS)
1000.0000 mL | Freq: Once | INTRAVENOUS | Status: AC
  Administered 2015-09-24: 1000 mL via INTRAVENOUS

## 2015-09-24 MED ORDER — ONDANSETRON HCL 4 MG/2ML IJ SOLN
4.0000 mg | Freq: Once | INTRAMUSCULAR | Status: AC
  Administered 2015-09-24: 4 mg via INTRAVENOUS
  Filled 2015-09-24: qty 2

## 2015-09-24 MED ORDER — NICARDIPINE HCL IN NACL 20-0.86 MG/200ML-% IV SOLN: 3.0000 mg/h | Freq: Once | INTRAVENOUS | Status: DC

## 2015-09-24 MED ORDER — IOHEXOL 350 MG/ML SOLN
75.0000 mL | Freq: Once | INTRAVENOUS | Status: AC | PRN
  Administered 2015-09-24: 75 mL via INTRAVENOUS

## 2015-09-24 MED ORDER — HYDRALAZINE HCL 20 MG/ML IJ SOLN
10.0000 mg | Freq: Once | INTRAMUSCULAR | Status: AC
  Administered 2015-09-24: 10 mg via INTRAVENOUS
  Filled 2015-09-24: qty 1

## 2015-09-24 MED ORDER — MORPHINE SULFATE (PF) 2 MG/ML IV SOLN
2.0000 mg | Freq: Once | INTRAVENOUS | Status: AC
  Administered 2015-09-24: 2 mg via INTRAVENOUS
  Filled 2015-09-24: qty 1

## 2015-09-24 NOTE — ED Notes (Signed)
Pt left AMA; pt daughter signed that he was leaving AMA

## 2015-09-24 NOTE — ED Notes (Signed)
Pt here from home with c/o generalized weakness, left sided cp, bilateral foot pain and swelling, and ftt over the past few weeks now. Daughter states he has not been taking his medications, stating "they have been mixed up for a while now." Pt is not a good historian, daughter seems uninvolved with his care. Pt is extremely thin, states he has been unable to eat for a while now, as he is chewing his food, it just expands and he is unable to swallow it. Pt lives alone, however, is "supposed to have a caregiver" per pts daughter. Pt states he falls often and has no one to help him up. Falls are r/t weakness he states. Pt is diabetic and does not check his blood glucose regularly.
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
## 2015-09-24 NOTE — ED Notes (Signed)
Patient transported to CT 

## 2015-09-24 NOTE — ED Notes (Signed)
Pt presents via triage w/ complaints of pain "all over."  Pt states he has been dealing with this for about 6 months.  Pt daughter reports he has not had any of his medications for about six months stating, "he has be been sent from one doctor to the other."  Pt appears uncomfortable and malnourished.  Pt states he has not been eating due to difficulty swallowing, states "its been going on for a while."  Pt A/Ox4, hypertensive at this time.  Daughter at bedside.

## 2015-09-24 NOTE — Discharge Instructions (Signed)
You were seen in the emergency department for pain all over. Your blood pressure is severely elevated and your heart is showing evidence of damage as a result of your high blood pressure. This needs to be corrected or you could have a stroke or heart attack or die. You decided to leave AGAINST MEDICAL ADVICE. Return at any time if your symptoms worsen, you're having worsening pain, worsening chest pain, shortness of breath, lightheadedness, fainting, numbness or weakness, vision change, severe headache or if you change your mind, you're welcome to return to the emergency department for additional treatment and we are happy to care for you.
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
## 2015-09-24 NOTE — ED Notes (Signed)
cbg in triage, 342.

## 2015-10-01 ENCOUNTER — Encounter: Payer: Self-pay | Admitting: *Deleted

## 2015-10-01 ENCOUNTER — Emergency Department
Admission: EM | Admit: 2015-10-01 | Discharge: 2015-10-01 | Disposition: A | Discharge status: Home / Self Care | Payer: Medicaid Other | Attending: Emergency Medicine | Admitting: Emergency Medicine

## 2015-10-01 DIAGNOSIS — I10 Essential (primary) hypertension: Secondary | ICD-10-CM | POA: Insufficient documentation

## 2015-10-01 DIAGNOSIS — R739 Hyperglycemia, unspecified: Secondary | ICD-10-CM

## 2015-10-01 DIAGNOSIS — F17210 Nicotine dependence, cigarettes, uncomplicated: Secondary | ICD-10-CM | POA: Insufficient documentation

## 2015-10-01 DIAGNOSIS — E1365 Other specified diabetes mellitus with hyperglycemia: Secondary | ICD-10-CM | POA: Diagnosis present

## 2015-10-01 DIAGNOSIS — E1340 Other specified diabetes mellitus with diabetic neuropathy, unspecified: Secondary | ICD-10-CM | POA: Diagnosis not present

## 2015-10-01 DIAGNOSIS — M79671 Pain in right foot: Secondary | ICD-10-CM | POA: Diagnosis not present

## 2015-10-01 LAB — BASIC METABOLIC PANEL
ANION GAP: 5 (ref 5–15)
BUN: 5 mg/dL — AB (ref 6–20)
CHLORIDE: 105 mmol/L (ref 101–111)
CO2: 29 mmol/L (ref 22–32)
Calcium: 7.6 mg/dL — ABNORMAL LOW (ref 8.9–10.3)
Creatinine, Ser: 0.58 mg/dL — ABNORMAL LOW (ref 0.61–1.24)
GFR calc Af Amer: >60 mL/min (ref >60)
GFR calc non Af Amer: >60 mL/min (ref >60)
GLUCOSE: 362 mg/dL — AB (ref 65–99)
Potassium: 3 mmol/L — ABNORMAL LOW (ref 3.5–5.1)
SODIUM: 139 mmol/L (ref 135–145)

## 2015-10-01 LAB — CBC
HEMATOCRIT: 33.9 % — AB (ref 40.0–52.0)
HEMOGLOBIN: 11.5 g/dL — AB (ref 13.0–18.0)
MCH: 29.7 pg (ref 26.0–34.0)
MCHC: 33.9 g/dL (ref 32.0–36.0)
MCV: 87.4 fL (ref 80.0–100.0)
Platelets: 94 10*3/uL — ABNORMAL LOW (ref 150–440)
RBC: 3.87 MIL/uL — ABNORMAL LOW (ref 4.40–5.90)
RDW: 13.8 % (ref 11.5–14.5)
WBC: 3.5 10*3/uL — AB (ref 3.8–10.6)

## 2015-10-01 LAB — GLUCOSE, CAPILLARY: Glucose-Capillary: 399 mg/dL — ABNORMAL HIGH (ref 65–99)

## 2015-10-01 MED ORDER — INSULIN ASPART 100 UNIT/ML ~~LOC~~ SOLN: 8.0000 [IU] | Freq: Once | SUBCUTANEOUS | Status: DC

## 2015-10-01 MED ORDER — INSULIN ASPART 100 UNIT/ML ~~LOC~~ SOLN
6.0000 [IU] | Freq: Once | SUBCUTANEOUS | Status: AC
  Administered 2015-10-01: 6 [IU] via INTRAVENOUS
  Filled 2015-10-01: qty 6

## 2015-10-01 MED ORDER — SODIUM CHLORIDE 0.9 % IV BOLUS (SEPSIS)
1000.0000 mL | Freq: Once | INTRAVENOUS | Status: AC
  Administered 2015-10-01: 1000 mL via INTRAVENOUS

## 2015-10-01 MED ORDER — OXYCODONE-ACETAMINOPHEN 5-325 MG PO TABS
1.0000 | ORAL_TABLET | Freq: Once | ORAL | Status: AC
  Administered 2015-10-01: 1 via ORAL
  Filled 2015-10-01: qty 1

## 2015-10-01 NOTE — ED Notes (Signed)
Per Dr. Lenard Lance, patient wished to sign out AMA

## 2015-10-01 NOTE — Discharge Instructions (Signed)
Diabetic Neuropathy °Diabetic neuropathy is a nerve disease or nerve damage that is caused by diabetes mellitus. About half of all people with diabetes mellitus have some form of nerve damage. Nerve damage is more common in those who have had diabetes mellitus for many years and who generally have not had good control of their blood sugar (glucose) level. Diabetic neuropathy is a common complication of diabetes mellitus. There are three common types of diabetic neuropathy and a fourth type that is less common and less understood:  °· Peripheral neuropathy--This is the most common type of diabetic neuropathy. It causes damage to the nerves of the feet and legs first and then eventually the hands and arms. The damage affects the ability to sense touch. °· Autonomic neuropathy--This type causes damage to the autonomic nervous system, which controls the following functions: °¨ Heartbeat. °¨ Body temperature. °¨ Blood pressure. °¨ Urination. °¨ Digestion. °¨ Sweating. °¨ Sexual function. °· Focal neuropathy--Focal neuropathy can be painful and unpredictable and occurs most often in older adults with diabetes mellitus. It involves a specific nerve or one area and often comes on suddenly. It usually does not cause long-term problems. °· Radiculoplexus neuropathy-- Sometimes called lumbosacral radiculoplexus neuropathy, radiculoplexus neuropathy affects the nerves of the thighs, hips, buttocks, or legs. It is more common in people with type 2 diabetes mellitus and in older men. It is characterized by debilitating pain, weakness, and atrophy, usually in the thigh muscles. °CAUSES  °The cause of peripheral, autonomic, and focal neuropathies is diabetes mellitus that is uncontrolled and high glucose levels. The cause of radiculoplexus neuropathy is unknown. However, it is thought to be caused by inflammation related to uncontrolled glucose levels. °SIGNS AND SYMPTOMS  °Peripheral Neuropathy °Peripheral neuropathy develops  slowly over time. When the nerves of the feet and legs no longer work there may be:  °· Burning, stabbing, or aching pain in the legs or feet. °· Inability to feel pressure or pain in your feet. This can lead to: °¨ Thick calluses over pressure areas. °¨ Pressure sores. °¨ Ulcers. °· Foot deformities. °· Reduced ability to feel temperature changes. °· Muscle weakness. °Autonomic Neuropathy °The symptoms of autonomic neuropathy vary depending on which nerves are affected. Symptoms may include: °· Problems with digestion, such as: °¨ Feeling sick to your stomach (nausea). °¨ Vomiting. °¨ Bloating. °¨ Constipation. °¨ Diarrhea. °¨ Abdominal pain. °· Difficulty with urination. This occurs if you lose your ability to sense when your bladder is full. Problems include: °¨ Urine leakage (incontinence). °¨ Inability to empty your bladder completely (retention). °· Rapid or irregular heartbeat (palpitations). °· Blood pressure drops when you stand up (orthostatic hypotension). When you stand up you may feel: °¨ Dizzy. °¨ Weak. °¨ Faint. °· In men, inability to attain and maintain an erection. °· In women, vaginal dryness and problems with decreased sexual desire and arousal. °· Problems with body temperature regulation. °· Increased or decreased sweating. °Focal Neuropathy °· Abnormal eye movements or abnormal alignment of both eyes. °· Weakness in the wrist. °· Foot drop. This results in an inability to lift the foot properly and abnormal walking or foot movement. °· Paralysis on one side of your face (Bell palsy). °· Chest or abdominal pain. °Radiculoplexus Neuropathy °· Sudden, severe pain in your hip, thigh, or buttocks. °· Weakness and wasting of thigh muscles. °· Difficulty rising from a seated position. °· Abdominal swelling. °· Unexplained weight loss (usually more than 10 lb [4.5 kg]). °DIAGNOSIS  °Peripheral Neuropathy °Your senses may be   tested. Sensory function testing can be done with: °· A light touch using a  monofilament. °· A vibration with tuning fork. °· A sharp sensation with a pin prick. °Other tests that can help diagnose neuropathy are: °· Nerve conduction velocity. This test checks the transmission of an electrical current through a nerve. °· Electromyography. This shows how muscles respond to electrical signals transmitted by nearby nerves. °· Quantitative sensory testing. This is used to assess how your nerves respond to vibrations and changes in temperature. °Autonomic Neuropathy °Diagnosis is often based on reported symptoms. Tell your health care provider if you experience:  °· Dizziness.   °· Constipation.   °· Diarrhea.   °· Inappropriate urination or inability to urinate.   °· Inability to get or maintain an erection.   °Tests that may be done include:  °· Electrocardiography or Holter monitor. These are tests that can help show problems with the heart rate or heart rhythm.   °· An X-ray exam may be done. °Focal Neuropathy °Diagnosis is made based on your symptoms and what your health care provider finds during your exam. Other tests may be done. They may include: °· Nerve conduction velocities. This checks the transmission of electrical current through a nerve. °· Electromyography. This shows how muscles respond to electrical signals transmitted by nearby nerves. °· Quantitative sensory testing. This test is used to assess how your nerves respond to vibration and changes in temperature. °Radiculoplexus Neuropathy °· Often the first thing is to eliminate any other issue or problems that might be the cause, as there is no stick test for diagnosis. °· X-ray exam of your spine and lumbar region. °· Spinal tap to rule out cancer. °· MRI to rule out other lesions. °TREATMENT  °Once nerve damage occurs, it cannot be reversed. The goal of treatment is to keep the disease or nerve damage from getting worse and affecting more nerve fibers. Controlling your blood glucose level is the key. Most people with  radiculoplexus neuropathy see at least a partial improvement over time. You will need to keep your blood glucose and HbA1c levels in the target range determined by your health care provider. Things that help control blood glucose levels include:  °· Blood glucose monitoring.   °· Meal planning.   °· Physical activity.   °· Diabetes medicine.   °Over time, maintaining lower blood glucose levels helps lessen symptoms. Sometimes, prescription pain medicine is needed. °HOME CARE INSTRUCTIONS: °· Do not smoke. °· Keep your blood glucose level in the range that you and your health care provider have determined acceptable for you. °· Keep your blood pressure level in the range that you and your health care provider have determined acceptable for you. °· Eat a well-balanced diet. °· Be physically active every day. Include strength training and balance exercises. °· Protect your feet. °¨ Check your feet every day for sores, cuts, blisters, or signs of infection. °¨ Wear padded socks and supportive shoes. Use orthotic inserts, if necessary. °¨ Regularly check the insides of your shoes for worn spots. Make sure there are no rocks or other items inside your shoes before you put them on. °SEEK MEDICAL CARE IF:  °· You have burning, stabbing, or aching pain in the legs or feet. °· You are unable to feel pressure or pain in your feet. °· You develop problems with digestion such as: °¨ Nausea. °¨ Vomiting. °¨ Bloating. °¨ Constipation. °¨ Diarrhea. °¨ Abdominal pain. °· You have difficulty with urination, such as: °¨ Incontinence. °¨ Retention. °· You have palpitations. °· You   develop orthostatic hypotension. When you stand up you may feel: °¨ Dizzy. °¨ Weak. °¨ Faint. °· You cannot attain and maintain an erection (in men). °· You have vaginal dryness and problems with decreased sexual desire and arousal (in women). °· You have severe pain in your thighs, legs, or buttocks. °· You have unexplained weight loss. °  °This information  is not intended to replace advice given to you by your health care provider. Make sure you discuss any questions you have with your health care provider. °  °Document Released: 10/24/2001 Document Revised: 09/05/2014 Document Reviewed: 01/24/2013 °[employer]vier Interactive Patient Education ©2016 [employer] °Hyperglycemia °Hyperglycemia occurs when the glucose (sugar) in your blood is too high. Hyperglycemia can happen for many reasons, but it most often happens to people who do not know they have diabetes or are not managing their diabetes properly.  °CAUSES  °Whether you have diabetes or not, there are other causes of hyperglycemia. Hyperglycemia can occur when you have diabetes, but it can also occur in other situations that you might not be as aware of, such as: °Diabetes °· If you have diabetes and are having problems controlling your blood glucose, hyperglycemia could occur because of some of the following reasons: °¨ Not following your meal plan. °¨ Not taking your diabetes medications or not taking it properly. °¨ Exercising less or doing less activity than you normally do. °¨ Being sick. °Pre-diabetes °· This cannot be ignored. Before people develop Type 2 diabetes, they almost always have "pre-diabetes." This is when your blood glucose levels are higher than normal, but not yet high enough to be diagnosed as diabetes. Research has shown that some long-term damage to the body, especially the heart and circulatory system, may already be occurring during pre-diabetes. If you take action to manage your blood glucose when you have pre-diabetes, you may delay or prevent Type 2 diabetes from developing. °Stress °· If you have diabetes, you may be "diet" controlled or on oral medications or insulin to control your diabetes. However, you may find that your blood glucose is higher than usual in the hospital whether you have diabetes or not. This is often referred to as "stress hyperglycemia." Stress can elevate your  blood glucose. This happens because of hormones put out by the body during times of stress. If stress has been the cause of your high blood glucose, it can be followed regularly by your caregiver. That way he/she can make sure your hyperglycemia does not continue to get worse or progress to diabetes. °Steroids °· Steroids are medications that act on the infection fighting system (immune system) to block inflammation or infection. One side effect can be a rise in blood glucose. Most people can produce enough extra insulin to allow for this rise, but for those who cannot, steroids make blood glucose levels go even higher. It is not unusual for steroid treatments to "uncover" diabetes that is developing. It is not always possible to determine if the hyperglycemia will go away after the steroids are stopped. A special blood test called an A1c is sometimes done to determine if your blood glucose was elevated before the steroids were started. °SYMPTOMS °· Thirsty. °· Frequent urination. °· Dry mouth. °· Blurred vision. °· Tired or fatigue. °· Weakness. °· Sleepy. °· Tingling in feet or leg. °DIAGNOSIS  °Diagnosis is made by monitoring blood glucose in one or all of the following ways: °· A1c test. This is a chemical found in your blood. °· Fingerstick blood glucose   monitoring. °· Laboratory results. °TREATMENT  °First, knowing the cause of the hyperglycemia is important before the hyperglycemia can be treated. Treatment may include, but is not be limited to: °· Education. °· Change or adjustment in medications. °· Change or adjustment in meal plan. °· Treatment for an illness, infection, etc. °· More frequent blood glucose monitoring. °· Change in exercise plan. °· Decreasing or stopping steroids. °· Lifestyle changes. °HOME CARE INSTRUCTIONS  °· Test your blood glucose as directed. °· Exercise regularly. Your caregiver will give you instructions about exercise. Pre-diabetes or diabetes which comes on with stress is  helped by exercising. °· Eat wholesome, balanced meals. Eat often and at regular, fixed times. Your caregiver or nutritionist will give you a meal plan to guide your sugar intake. °· Being at an ideal weight is important. If needed, losing as little as 10 to 15 pounds may help improve blood glucose levels. °SEEK MEDICAL CARE IF:  °· You have questions about medicine, activity, or diet. °· You continue to have symptoms (problems such as increased thirst, urination, or weight gain). °SEEK IMMEDIATE MEDICAL CARE IF:  °· You are vomiting or have diarrhea. °· Your breath smells fruity. °· You are breathing faster or slower. °· You are very sleepy or incoherent. °· You have numbness, tingling, or pain in your feet or hands. °· You have chest pain. °· Your symptoms get worse even though you have been following your caregiver's orders. °· If you have any other questions or concerns. °  °This information is not intended to replace advice given to you by your health care provider. Make sure you discuss any questions you have with your health care provider. °  °Document Released: 02/08/2001 Document Revised: 11/07/2011 Document Reviewed: 04/21/2015 °Elsevier Interactive Patient Education ©2016 [employer] ° °

## 2015-10-01 NOTE — ED Provider Notes (Signed)
[facility] Emergency Department Provider Note  Time seen: 12:54 PM  I have reviewed the triage vital signs and the nursing notes.   HISTORY  Chief Complaint Hyperglycemia    HPI Ian [name] is a 65 y.o. male with a past medical history of diabetes, hypertension, hepatitis C with cirrhosis, neuropathy who presents the emergency department with an elevated blood glucose.Patient was seen by his PCP today per patient for the pain which is related to his "diabetic nerve pain" per patient. While at the primary care doctor's office she was noted to be hyperglycemic and sent to the emergency department for further evaluation. According to the patient he does not have insulin at home, and does not take anything for his neuropathy. Describes the pain as moderate, burning sensation in both his feet.    Past Medical History  Diagnosis Date  . Diabetes mellitus   . HTN (hypertension)   . Cirrhosis of liver (HCC)   . Hepatitis C   . Neuropathy (HCC)   . Cirrhosis (HCC)   . Hepatitis C     There are no active problems to display for this patient.   Past Surgical History  Procedure Laterality Date  . Mandible surgery    . Dental surgery      teeth extraction    No current outpatient prescriptions on file.  Allergies Review of patient's allergies indicates no known allergies.  History reviewed. No pertinent family history.  Social History Social History  Substance Use Topics  . Smoking status: Current Every Day Smoker -- 0.50 packs/day    Types: Cigarettes  . Smokeless tobacco: Never Used  . Alcohol Use: 1.8 oz/week    3 Cans of beer per week    Review of Systems Constitutional: Negative for fever. Cardiovascular: Negative for chest pain. Respiratory: Negative for shortness of breath. Gastrointestinal: Negative for abdominal pain Neurological: Negative for headache 10-point ROS otherwise  negative.  ____________________________________________   PHYSICAL EXAM:  VITAL SIGNS: ED Triage Vitals  Enc Vitals Group     BP 10/01/15 1109 142/95 mmHg     Pulse Rate 10/01/15 1109 84     Resp 10/01/15 1109 18     Temp 10/01/15 1109 97.7 F (36.5 C)     Temp Source 10/01/15 1109 Oral     SpO2 10/01/15 1109 99 %     Weight 10/01/15 1109 95 lb (43.092 kg)     Height 10/01/15 1109  (1.778 m)     Head Cir --      Peak Flow --      Pain Score 10/01/15 1110 0     Pain Loc --      Pain Edu? --      Excl. in GC? --     Constitutional: Alert and oriented. Well appearing and in no distress. Eyes: Normal exam ENT   Head: Normocephalic and atraumatic.   Mouth/Throat: Mucous membranes are moist. Cardiovascular: Normal rate, regular rhythm. No murmur Respiratory: Normal respiratory effort without tachypnea nor retractions. Breath sounds are clear  Gastrointestinal: Soft and nontender. No distention.   Musculoskeletal: Nontender with normal range of motion in all extremities. Moderate tenderness palpation of bilateral feet, neurovascular intact with 2+ DP pulses. No erythema. Neurologic:  Normal speech and language. No gross focal neurologic deficits  Skin:  Skin is warm, dry and intact.  Psychiatric: Mood and affect are normal. Speech and behavior are normal.  ____________________________________________   INITIAL IMPRESSION / ASSESSMENT AND PLAN /  ED COURSE  Pertinent labs & imaging results that were available during my care of the patient were reviewed by me and considered in my medical decision making (see chart for details).  Patient presents the emergency department long-standing bilateral foot pain which is likely due to his diabetic neuropathy. Patient also has an elevated glucose, and sent by his primary care physician for this. Here the patient has a fingerstick blood glucose of 399. Patient has moderate tenderness palpation of bilateral feet, but overall  normal appearance, neurovascularly intact, no erythema or signs of infection. Good range of motion.  We will dose subcutaneous insulin, oral fluids, close monitoring the emergency department. I will dose a one-time dose of Percocet for the patient's bilateral foot pain while awaiting lab results. We will also obtain a social work consult is a patient states he does not have any diabetic supplies at home.  Patient's daughters are here. They state the patient has received diabetic supplies in the past, but he does not have any. The patient states he doesn't take anything because he doesn't have anything to take. I discussed with the patient that I would like to get social worker involved so they can talk to him about diabetic supplies, and means to get his prescriptions filled. Patient states he does not want to wait here any longer, he is ready to go home and he wants to "sign out." Patient then began removing blood pressure cuff and electrode leads.  We will have the patient sign out AGAINST MEDICAL ADVICE.  ____________________________________________   FINAL CLINICAL IMPRESSION(S) / ED DIAGNOSES  Hyperglycemia Diabetic neuropathy   Minna Antis, MD 10/01/15 [phone]

## 2015-10-01 NOTE — ED Notes (Signed)
Patient states that he was being seen at his MD office today and they sent him over to the ED to be evaluated because his sugar is high. Patient is also c/o bilateral leg and feet pain. Patient states that he has "diabetic nerve pain", he does not take anything for this at home.

## 2015-10-01 NOTE — ED Notes (Addendum)
Pt states he was sent from his PCP, states he was told his sugar was high, states he does not have a meter or insulin at home, pt conitnues to state "I dont know" when asked any more questions, pt appears very skinny and malnourished

## 2015-11-04 ENCOUNTER — Inpatient Hospital Stay
Admission: EM | Admit: 2015-11-04 | Discharge: 2015-11-14 | DRG: 871 | Disposition: A | Discharge status: Skilled Nursing Facility | Payer: Medicaid Other | Attending: Internal Medicine | Admitting: Internal Medicine

## 2015-11-04 ENCOUNTER — Emergency Department: Payer: Medicaid Other

## 2015-11-04 ENCOUNTER — Encounter: Payer: Self-pay | Admitting: *Deleted

## 2015-11-04 DIAGNOSIS — F1020 Alcohol dependence, uncomplicated: Secondary | ICD-10-CM | POA: Diagnosis present

## 2015-11-04 DIAGNOSIS — E162 Hypoglycemia, unspecified: Secondary | ICD-10-CM | POA: Diagnosis present

## 2015-11-04 DIAGNOSIS — I248 Other forms of acute ischemic heart disease: Secondary | ICD-10-CM | POA: Diagnosis present

## 2015-11-04 DIAGNOSIS — I10 Essential (primary) hypertension: Secondary | ICD-10-CM | POA: Diagnosis present

## 2015-11-04 DIAGNOSIS — G8929 Other chronic pain: Secondary | ICD-10-CM | POA: Diagnosis present

## 2015-11-04 DIAGNOSIS — F0391 Unspecified dementia with behavioral disturbance: Secondary | ICD-10-CM

## 2015-11-04 DIAGNOSIS — J439 Emphysema, unspecified: Secondary | ICD-10-CM | POA: Diagnosis present

## 2015-11-04 DIAGNOSIS — J9811 Atelectasis: Secondary | ICD-10-CM | POA: Diagnosis present

## 2015-11-04 DIAGNOSIS — F17210 Nicotine dependence, cigarettes, uncomplicated: Secondary | ICD-10-CM | POA: Diagnosis present

## 2015-11-04 DIAGNOSIS — R109 Unspecified abdominal pain: Secondary | ICD-10-CM

## 2015-11-04 DIAGNOSIS — E872 Acidosis: Secondary | ICD-10-CM | POA: Diagnosis present

## 2015-11-04 DIAGNOSIS — F1410 Cocaine abuse, uncomplicated: Secondary | ICD-10-CM

## 2015-11-04 DIAGNOSIS — R944 Abnormal results of kidney function studies: Secondary | ICD-10-CM | POA: Diagnosis not present

## 2015-11-04 DIAGNOSIS — N390 Urinary tract infection, site not specified: Secondary | ICD-10-CM

## 2015-11-04 DIAGNOSIS — E11649 Type 2 diabetes mellitus with hypoglycemia without coma: Secondary | ICD-10-CM | POA: Diagnosis present

## 2015-11-04 DIAGNOSIS — A4151 Sepsis due to Escherichia coli [E. coli]: Secondary | ICD-10-CM | POA: Diagnosis present

## 2015-11-04 DIAGNOSIS — A419 Sepsis, unspecified organism: Secondary | ICD-10-CM | POA: Diagnosis not present

## 2015-11-04 DIAGNOSIS — F0280 Dementia in other diseases classified elsewhere without behavioral disturbance: Secondary | ICD-10-CM

## 2015-11-04 DIAGNOSIS — F1210 Cannabis abuse, uncomplicated: Secondary | ICD-10-CM | POA: Diagnosis present

## 2015-11-04 DIAGNOSIS — E860 Dehydration: Secondary | ICD-10-CM | POA: Diagnosis present

## 2015-11-04 DIAGNOSIS — K760 Fatty (change of) liver, not elsewhere classified: Secondary | ICD-10-CM | POA: Diagnosis present

## 2015-11-04 DIAGNOSIS — R64 Cachexia: Secondary | ICD-10-CM | POA: Diagnosis present

## 2015-11-04 DIAGNOSIS — D638 Anemia in other chronic diseases classified elsewhere: Secondary | ICD-10-CM | POA: Diagnosis present

## 2015-11-04 DIAGNOSIS — E1142 Type 2 diabetes mellitus with diabetic polyneuropathy: Secondary | ICD-10-CM | POA: Diagnosis present

## 2015-11-04 DIAGNOSIS — D6959 Other secondary thrombocytopenia: Secondary | ICD-10-CM | POA: Diagnosis present

## 2015-11-04 DIAGNOSIS — E1165 Type 2 diabetes mellitus with hyperglycemia: Secondary | ICD-10-CM | POA: Diagnosis present

## 2015-11-04 DIAGNOSIS — Z515 Encounter for palliative care: Secondary | ICD-10-CM | POA: Diagnosis not present

## 2015-11-04 DIAGNOSIS — D696 Thrombocytopenia, unspecified: Secondary | ICD-10-CM | POA: Diagnosis not present

## 2015-11-04 DIAGNOSIS — T380X5A Adverse effect of glucocorticoids and synthetic analogues, initial encounter: Secondary | ICD-10-CM | POA: Diagnosis not present

## 2015-11-04 DIAGNOSIS — K7031 Alcoholic cirrhosis of liver with ascites: Secondary | ICD-10-CM | POA: Diagnosis present

## 2015-11-04 DIAGNOSIS — D689 Coagulation defect, unspecified: Secondary | ICD-10-CM | POA: Diagnosis present

## 2015-11-04 DIAGNOSIS — R627 Adult failure to thrive: Secondary | ICD-10-CM | POA: Diagnosis present

## 2015-11-04 DIAGNOSIS — E43 Unspecified severe protein-calorie malnutrition: Secondary | ICD-10-CM | POA: Diagnosis present

## 2015-11-04 DIAGNOSIS — N10 Acute tubulo-interstitial nephritis: Secondary | ICD-10-CM | POA: Diagnosis present

## 2015-11-04 DIAGNOSIS — G9341 Metabolic encephalopathy: Secondary | ICD-10-CM | POA: Diagnosis present

## 2015-11-04 DIAGNOSIS — B182 Chronic viral hepatitis C: Secondary | ICD-10-CM | POA: Diagnosis present

## 2015-11-04 DIAGNOSIS — F1010 Alcohol abuse, uncomplicated: Secondary | ICD-10-CM

## 2015-11-04 DIAGNOSIS — K861 Other chronic pancreatitis: Secondary | ICD-10-CM | POA: Diagnosis present

## 2015-11-04 DIAGNOSIS — K7290 Hepatic failure, unspecified without coma: Secondary | ICD-10-CM

## 2015-11-04 DIAGNOSIS — F03918 Unspecified dementia, unspecified severity, with other behavioral disturbance: Secondary | ICD-10-CM

## 2015-11-04 DIAGNOSIS — K7030 Alcoholic cirrhosis of liver without ascites: Secondary | ICD-10-CM | POA: Diagnosis not present

## 2015-11-04 DIAGNOSIS — N200 Calculus of kidney: Secondary | ICD-10-CM | POA: Diagnosis present

## 2015-11-04 DIAGNOSIS — Z681 Body mass index (BMI) 19 or less, adult: Secondary | ICD-10-CM | POA: Diagnosis not present

## 2015-11-04 DIAGNOSIS — R17 Unspecified jaundice: Secondary | ICD-10-CM | POA: Diagnosis present

## 2015-11-04 LAB — CBC WITH DIFFERENTIAL/PLATELET
BASOS ABS: 0 10*3/uL (ref 0–0.1)
Basophils Relative: 0 %
EOS ABS: 0 10*3/uL (ref 0–0.7)
Eosinophils Relative: 0 %
HEMATOCRIT: 29.8 % — AB (ref 40.0–52.0)
Hemoglobin: 9.9 g/dL — ABNORMAL LOW (ref 13.0–18.0)
LYMPHS ABS: 0.5 10*3/uL — AB (ref 1.0–3.6)
LYMPHS PCT: 5 %
MCH: 28.4 pg (ref 26.0–34.0)
MCHC: 33.4 g/dL (ref 32.0–36.0)
MCV: 85.1 fL (ref 80.0–100.0)
MONOS PCT: 11 %
Monocytes Absolute: 1.2 10*3/uL — ABNORMAL HIGH (ref 0.2–1.0)
Neutro Abs: 9.2 10*3/uL — ABNORMAL HIGH (ref 1.4–6.5)
Neutrophils Relative %: 84 %
PLATELETS: 22 10*3/uL — AB (ref 150–440)
RBC: 3.5 MIL/uL — AB (ref 4.40–5.90)
RDW: 15 % — AB (ref 11.5–14.5)
WBC: 10.9 10*3/uL — AB (ref 3.8–10.6)

## 2015-11-04 LAB — URINALYSIS COMPLETE WITH MICROSCOPIC (ARMC ONLY)
BILIRUBIN URINE: NEGATIVE
KETONES UR: NEGATIVE mg/dL
NITRITE: NEGATIVE
Protein, ur: 30 mg/dL — AB
SPECIFIC GRAVITY, URINE: 1.009 (ref 1.005–1.030)
Squamous Epithelial / LPF: NONE SEEN
pH: 6 (ref 5.0–8.0)

## 2015-11-04 LAB — COMPREHENSIVE METABOLIC PANEL
ALBUMIN: 2 g/dL — AB (ref 3.5–5.0)
ALT: 185 U/L — ABNORMAL HIGH (ref 17–63)
ANION GAP: 8 (ref 5–15)
AST: 333 U/L — AB (ref 15–41)
Alkaline Phosphatase: 706 U/L — ABNORMAL HIGH (ref 38–126)
BILIRUBIN TOTAL: 6.7 mg/dL — AB (ref 0.3–1.2)
BUN: 42 mg/dL — AB (ref 6–20)
CHLORIDE: 100 mmol/L — AB (ref 101–111)
CO2: 29 mmol/L (ref 22–32)
Calcium: 8.4 mg/dL — ABNORMAL LOW (ref 8.9–10.3)
Creatinine, Ser: 0.63 mg/dL (ref 0.61–1.24)
GFR calc Af Amer: >60 mL/min (ref >60)
GFR calc non Af Amer: >60 mL/min (ref >60)
GLUCOSE: 55 mg/dL — AB (ref 65–99)
POTASSIUM: 3.4 mmol/L — AB (ref 3.5–5.1)
SODIUM: 137 mmol/L (ref 135–145)
Total Protein: 6.3 g/dL — ABNORMAL LOW (ref 6.5–8.1)

## 2015-11-04 LAB — URINE DRUG SCREEN, QUALITATIVE (ARMC ONLY)
AMPHETAMINES, UR SCREEN: NOT DETECTED
Barbiturates, Ur Screen: NOT DETECTED
Benzodiazepine, Ur Scrn: NOT DETECTED
CANNABINOID 50 NG, UR ~~LOC~~: POSITIVE — AB
COCAINE METABOLITE, UR ~~LOC~~: POSITIVE — AB
MDMA (ECSTASY) UR SCREEN: NOT DETECTED
Methadone Scn, Ur: NOT DETECTED
OPIATE, UR SCREEN: NOT DETECTED
PHENCYCLIDINE (PCP) UR S: NOT DETECTED
Tricyclic, Ur Screen: NOT DETECTED

## 2015-11-04 LAB — ACETAMINOPHEN LEVEL: Acetaminophen (Tylenol), Serum: <10 ug/mL — ABNORMAL LOW (ref 10–30)

## 2015-11-04 LAB — PROTIME-INR
INR: 1.9
Prothrombin Time: 21.7 seconds — ABNORMAL HIGH (ref 11.4–15.0)

## 2015-11-04 LAB — LACTIC ACID, PLASMA: Lactic Acid, Venous: 1.7 mmol/L (ref 0.5–2.0)

## 2015-11-04 LAB — AMMONIA

## 2015-11-04 LAB — TROPONIN I: TROPONIN I: 0.17 ng/mL — AB (ref <0.031)

## 2015-11-04 LAB — LIPASE, BLOOD

## 2015-11-04 LAB — GLUCOSE, CAPILLARY: Glucose-Capillary: 123 mg/dL — ABNORMAL HIGH (ref 65–99)

## 2015-11-04 LAB — ETHANOL

## 2015-11-04 MED ORDER — ONDANSETRON HCL 4 MG PO TABS: 4.0000 mg | ORAL_TABLET | Freq: Four times a day (QID) | ORAL | Status: DC | PRN

## 2015-11-04 MED ORDER — CLONIDINE HCL 0.1 MG/24HR TD PTWK
0.1000 mg | MEDICATED_PATCH | TRANSDERMAL | Status: DC
  Administered 2015-11-05 – 2015-11-11 (×2): 0.1 mg via TRANSDERMAL
  Filled 2015-11-04 (×2): qty 1

## 2015-11-04 MED ORDER — DEXTROSE 50 % IV SOLN
1.0000 | Freq: Once | INTRAVENOUS | Status: AC
  Administered 2015-11-04: 50 mL via INTRAVENOUS

## 2015-11-04 MED ORDER — HYDRALAZINE HCL 20 MG/ML IJ SOLN
10.0000 mg | INTRAMUSCULAR | Status: AC
  Administered 2015-11-04: 10 mg via INTRAVENOUS
  Filled 2015-11-04: qty 1

## 2015-11-04 MED ORDER — FENTANYL CITRATE (PF) 100 MCG/2ML IJ SOLN
50.0000 ug | Freq: Once | INTRAMUSCULAR | Status: AC
  Administered 2015-11-04: 50 ug via INTRAVENOUS
  Filled 2015-11-04: qty 2

## 2015-11-04 MED ORDER — SODIUM CHLORIDE 0.9 % IV BOLUS (SEPSIS)
1000.0000 mL | Freq: Once | INTRAVENOUS | Status: AC
  Administered 2015-11-04: 1000 mL via INTRAVENOUS

## 2015-11-04 MED ORDER — HYDRALAZINE HCL 20 MG/ML IJ SOLN: 10.0000 mg | Freq: Four times a day (QID) | INTRAMUSCULAR | Status: DC | PRN

## 2015-11-04 MED ORDER — MORPHINE SULFATE (PF) 2 MG/ML IV SOLN
2.0000 mg | INTRAVENOUS | Status: DC | PRN
  Administered 2015-11-04 – 2015-11-14 (×14): 2 mg via INTRAVENOUS
  Filled 2015-11-04 (×16): qty 1

## 2015-11-04 MED ORDER — DEXTROSE 50 % IV SOLN
25.0000 mL | Freq: Once | INTRAVENOUS | Status: AC
  Administered 2015-11-04: 25 mL via INTRAVENOUS

## 2015-11-04 MED ORDER — IOHEXOL 300 MG/ML  SOLN
75.0000 mL | Freq: Once | INTRAMUSCULAR | Status: AC | PRN
  Administered 2015-11-04: 75 mL via INTRAVENOUS

## 2015-11-04 MED ORDER — SODIUM CHLORIDE 0.9% FLUSH
3.0000 mL | Freq: Two times a day (BID) | INTRAVENOUS | Status: DC
  Administered 2015-11-04 – 2015-11-14 (×12): 3 mL via INTRAVENOUS

## 2015-11-04 MED ORDER — ACETAMINOPHEN 325 MG PO TABS: 650.0000 mg | ORAL_TABLET | Freq: Four times a day (QID) | ORAL | Status: DC | PRN

## 2015-11-04 MED ORDER — DEXTROSE 5 % IV SOLN
1.0000 g | Freq: Once | INTRAVENOUS | Status: AC
  Administered 2015-11-04: 1 g via INTRAVENOUS
  Filled 2015-11-04: qty 10

## 2015-11-04 MED ORDER — ONDANSETRON HCL 4 MG/2ML IJ SOLN: 4.0000 mg | Freq: Four times a day (QID) | INTRAMUSCULAR | Status: DC | PRN

## 2015-11-04 MED ORDER — DEXTROSE-NACL 5-0.45 % IV SOLN
INTRAVENOUS | Status: DC
  Administered 2015-11-04: 23:00:00 via INTRAVENOUS

## 2015-11-04 MED ORDER — MORPHINE SULFATE (PF) 2 MG/ML IV SOLN
1.0000 mg | INTRAVENOUS | Status: DC | PRN
  Administered 2015-11-04: 1 mg via INTRAVENOUS
  Filled 2015-11-04: qty 1

## 2015-11-04 MED ORDER — ACETAMINOPHEN 650 MG RE SUPP: 650.0000 mg | Freq: Four times a day (QID) | RECTAL | Status: DC | PRN

## 2015-11-04 NOTE — ED Notes (Signed)
CBG 124 

## 2015-11-04 NOTE — H&P (Addendum)
[facility] - [place] at [facility]   PATIENT NAME: Ian [name]    MR#:  [id number]  DATE OF BIRTH:  March 10, 1951  DATE OF ADMISSION:  11/04/2015  PRIMARY CARE PHYSICIAN: [name], PA   REQUESTING/REFERRING PHYSICIAN: Dr. Ileana Roup  CHIEF COMPLAINT:   Chief Complaint  Patient presents with  . Weakness  . Nausea  . Hypoglycemia    HISTORY OF PRESENT ILLNESS:  Jahlil Ziller  is a 65 y.o. male with a known history of diabetes mellitus, hypertension, alcoholic liver cirrhosis, chronic peripheral neuropathy sense from home secondary to poor by mouth intake and failure to thrive. Patient is extremely poor historian. He says he came in here due to a little bit of everything. Spoke to his daughter mentions that patient has been living with her for the past month. He appears very disheveled and hasn't been well taken care of by his appearance. Daughter was explained how sick the patient was but she says that she cannot care for him at home and he will need placement. He appears jaundiced and smells of urine. He hasn't been eating for the past few weeks at home. Occasional drinking beer continuous. Labs indicate jaundice, also thrombocytopenia and dehydration.  PAST MEDICAL HISTORY:   Past Medical History  Diagnosis Date  . Diabetes mellitus   . HTN (hypertension)   . Cirrhosis of liver (HCC)   . Hepatitis C   . Neuropathy (HCC)   . Cirrhosis (HCC)     PAST SURGICAL HISTORY:   Past Surgical History  Procedure Laterality Date  . Mandible surgery    . Dental surgery      teeth extraction    SOCIAL HISTORY:   Social History  Substance Use Topics  . Smoking status: Current Every Day Smoker -- 0.50 packs/day    Types: Cigarettes  . Smokeless tobacco: Never Used  . Alcohol Use: 1.8 oz/week    3 Cans of beer per week    FAMILY HISTORY:  History reviewed. No pertinent family history.  Unable to get any family history from patient.  DRUG  ALLERGIES:  No Known Allergies  REVIEW OF SYSTEMS:   Review of Systems  Unable to perform ROS: acuity of condition    MEDICATIONS AT HOME:   Prior to Admission medications   Not on File      VITAL SIGNS:  Blood pressure 163/110, pulse 93, temperature 97.4 F (36.3 C), temperature source Oral, resp. rate 11, height  (1.702 m), weight 40.824 kg (90 lb), SpO2 100 %.  PHYSICAL EXAMINATION:   Physical Exam  GENERAL:  65 y.o.-year-old patient lying in the bed with no acute distress.  EYES: Pupils equal, round, reactive to light and accommodation. No scleral icterus. Extraocular muscles intact.  HEENT: Head atraumatic, normocephalic. Oropharynx and nasopharynx clear.  NECK:  Supple, no jugular venous distention. No thyroid enlargement, no tenderness.  LUNGS: Normal breath sounds bilaterally, no wheezing, rales,rhonchi or crepitation. No use of accessory muscles of respiration.  CARDIOVASCULAR: S1, S2 normal. No murmurs, rubs, or gallops.  ABDOMEN: Soft, nontender, nondistended. Bowel sounds present. No organomegaly or mass.  EXTREMITIES: No pedal edema, cyanosis, or clubbing.  NEUROLOGIC: Cranial nerves II through XII are intact. Muscle strength 5/5 in all extremities. Sensation intact. Gait not checked.  PSYCHIATRIC: The patient is alert and oriented x 3.  SKIN: No obvious rash, lesion, or ulcer.   LABORATORY PANEL:   CBC  Recent Labs Lab 11/04/15 1737  WBC 10.9*  HGB  9.9*  HCT 29.8*  PLT 22*   ------------------------------------------------------------------------------------------------------------------  Chemistries   Recent Labs Lab 11/04/15 1737  NA 137  K 3.4*  CL 100*  CO2 29  GLUCOSE 55*  BUN 42*  CREATININE 0.63  CALCIUM 8.4*  AST 333*  ALT 185*  ALKPHOS 706*  BILITOT 6.7*   ------------------------------------------------------------------------------------------------------------------  Cardiac Enzymes  Recent Labs Lab 11/04/15 1737   TROPONINI 0.17*   ------------------------------------------------------------------------------------------------------------------  RADIOLOGY:  No results found.  EKG:   Orders placed or performed during the hospital encounter of 11/04/15  . ED EKG  . ED EKG  . EKG 12-Lead  . EKG 12-Lead    IMPRESSION AND PLAN:   Gerald LeitzBobby Georgi  is a 65 y.o. male with a known history of diabetes mellitus, hypertension, alcoholic liver cirrhosis, chronic peripheral neuropathy sense from home secondary to poor by mouth intake and failure to thrive.  #1 Jaundice-  Elevated LFts and also bilirubin, liver failure. Known h/o liver cirrhosis F/u CT abd GI consulted On clear liquids Overall poor prognosis- palliative care consulted Check ammonia as well  #2 Hypoglycemia-of failure to thrive. Poor by mouth intake.  -Started on D5 half normal saline. Finger sticks every 4 hours.  #3 Sepsis- from UTI, blood and urine cultures -started on rocephin  #4 Alcoholic liver cirrhosis- f/u CT, elevated lfts, continues to drink Overall poor prognosis Palliative care consulted.  #5 DM- sugars are low, started on D5 drip. continue to monitor  #6 HTN- Not on any home medications. Started on IV hydralazine when necessary for now  #7 Thrombocytopenia- could be from liver disease and also sepsis - monitor, no active bleeding. Transfuse if plts <20K  #8 DVT prophylaxis- TEDs and SCDs as low plts  #9 Elevated troponin- likely demand ischemia, no chest pain, monitor on tele - anyway low plts- so cannot give heparin IV - trend troponins    All the records are reviewed and case discussed with ED provider. Management plans discussed with the patient, family and they are in agreement.  CODE STATUS: Full Code  TOTAL TIME TAKING CARE OF THIS PATIENT: 55 minutes.   Daughters phone no: [phone]   Donne Robillard M.D on 11/04/2015 at 7:53 PM  Between 7am to 6pm - Pager - [phone]  After 6pm go  to [email or web address] - password [email or web address] [facility]  [facility]  Office  [phone]  CC: Primary care physician; Rico JunkerHANNE, [name], PA

## 2015-11-04 NOTE — Progress Notes (Signed)
Pt. C/o aching all over when he arrived to unit. Dr. Nemiah CommanderKalisetti notified and she's ordering more pain meds.

## 2015-11-04 NOTE — ED Notes (Signed)
Pt cleaned up and brief changed. Urine specimen collected. Pt provided with warm blankets.

## 2015-11-04 NOTE — ED Notes (Signed)
MD made aware of B/P. Per MD holding off on B/P meds right now. B/p 203/119

## 2015-11-04 NOTE — ED Notes (Signed)
Patient transported to CT 

## 2015-11-04 NOTE — ED Notes (Signed)
Floor unable to take pt due to BP out of limits, Dr. Truitt MerleNotified see MAR for resolved issue. Will take pt to floor when BP WDL.

## 2015-11-04 NOTE — ED Notes (Signed)
Lab made aware of INR add on.

## 2015-11-04 NOTE — ED Notes (Signed)
Blood sugar- 154

## 2015-11-04 NOTE — Progress Notes (Signed)
Pts. Troponin level is 0.17. Ian [name], nursing supervisor notified and said it's ok to take pt.

## 2015-11-04 NOTE — ED Provider Notes (Addendum)
[facility] Emergency Department Provider Note  ____________________________________________   I have reviewed the triage vital signs and the nursing notes.   HISTORY  Chief Complaint Weakness; Nausea; and Hypoglycemia    HPI Ian [name] is a 65 y.o. male history of hypertension, cirrhosis of the liver, denies ongoing alcohol abuse, history of diabetes mellitus, noncompliance, hepatitis C. Patient is a very poor historian and there is no family with him. According to patient and EMS, patient has not had anything to eat or drink the last several days. He states he has chronic neuropathy pain which has been seen in prior visits. Patient states he would like some pain medication. Currently EMS, patient was in his own urine and that quite emaciated. Ian [name] was 30. Patient denies chest pain he states his arms and legs hurt from his neuropathy.    Past Medical History  Diagnosis Date  . Diabetes mellitus   . HTN (hypertension)   . Cirrhosis of liver (HCC)   . Hepatitis C   . Neuropathy (HCC)   . Cirrhosis (HCC)   . Hepatitis C     There are no active problems to display for this patient.   Past Surgical History  Procedure Laterality Date  . Mandible surgery    . Dental surgery      teeth extraction    No current outpatient prescriptions on file.  Allergies Review of patient's allergies indicates no known allergies.  History reviewed. No pertinent family history.  Social History Social History  Substance Use Topics  . Smoking status: Current Every Day Smoker -- 0.50 packs/day    Types: Cigarettes  . Smokeless tobacco: Never Used  . Alcohol Use: 1.8 oz/week    3 Cans of beer per week    Review of SystemsLimited because of patient participation Constitutional: No fever/chills Eyes: No visual changes. ENT: No sore throat. No stiff neck no neck pain Cardiovascular: Denies chest pain. Respiratory: Denies shortness of  breath. Gastrointestinal:   no vomiting.  No diarrhea.  No constipation. Genitourinary: Negative for dysuria. Musculoskeletal: Negative lower extremity swelling Skin: Negative for rash. Neurological: Negative for headaches, focal weakness or numbness. 10-point ROS otherwise negative.  ____________________________________________   PHYSICAL EXAM:  VITAL SIGNS: ED Triage Vitals  Enc Vitals Group     BP 11/04/15 1730 152/107 mmHg     Pulse Rate 11/04/15 1734 96     Resp 11/04/15 1734 14     Temp 11/04/15 1734 97.4 F (36.3 C)     Temp Source 11/04/15 1734 Oral     SpO2 11/04/15 1734 100 %     Weight 11/04/15 1734 90 lb (40.824 kg)     Height 11/04/15 1734  (1.702 m)     Head Cir --      Peak Flow --      Pain Score --      Pain Loc --      Pain Edu? --      Excl. in GC? --     Constitutional:Patient is somnolent but alert and oriented. Cachectic and very ill-appearing at baseline and likely more so today  Eyes: Conjunctivae areicteric. PERRL. EOMI. Head: Atraumatic. Nose: No congestion/rhinnorhea. Mouth/Throat: Mucous membranes arevery dry.  Oropharynx non-erythematous. Neck: No stridor.   Nontender with no meningismus Cardiovascular: Normal rate, regular rhythm. Grossly normal heart sounds.  Good peripheral circulation. Respiratory: Normal respiratory effort.  No retractions. Lungs CTAB. Abdominal: Soft and nontender. No distention. No guarding no rebound, Cachectic  Back: Nontender Limited exam  Musculoskeletal:he complains of pain everywhere in his legs and arms. No joint effusions, no DVT signs strong distal pulses no edema Neurologic:  Normal speech and language. does not move the right side is much as the left & he states this is baseline  Skin:  Skin is warm, dry and intact. No rash noted. Psychiatric: Mood and affect are normal. Speech and behavior are normal.  ____________________________________________   LABS (all labs ordered are listed, but only  abnormal results are displayed)  Labs Reviewed  AMMONIA - Abnormal; Notable for the following:    Ammonia <9 (*)    All other components within normal limits  ACETAMINOPHEN LEVEL - Abnormal; Notable for the following:    Acetaminophen (Tylenol), Serum <10 (*)    All other components within normal limits  COMPREHENSIVE METABOLIC PANEL - Abnormal; Notable for the following:    Potassium 3.4 (*)    Chloride 100 (*)    Glucose, Bld 55 (*)    BUN 42 (*)    Calcium 8.4 (*)    Total Protein 6.3 (*)    Albumin 2.0 (*)    AST 333 (*)    ALT 185 (*)    Alkaline Phosphatase 706 (*)    Total Bilirubin 6.7 (*)    All other components within normal limits  TROPONIN I - Abnormal; Notable for the following:    Troponin I 0.17 (*)    All other components within normal limits  CBC WITH DIFFERENTIAL/PLATELET - Abnormal; Notable for the following:    WBC 10.9 (*)    RBC 3.50 (*)    Hemoglobin 9.9 (*)    HCT 29.8 (*)    RDW 15.0 (*)    All other components within normal limits  LIPASE, BLOOD - Abnormal; Notable for the following:    Lipase <10 (*)    All other components within normal limits  PROTIME-INR - Abnormal; Notable for the following:    Prothrombin Time 21.7 (*)    All other components within normal limits  ETHANOL  LACTIC ACID, PLASMA  LACTIC ACID, PLASMA  URINALYSIS COMPLETEWITH MICROSCOPIC ([facility] ONLY)  URINE DRUG SCREEN, QUALITATIVE ([facility] ONLY)  CBG MONITORING, ED   ____________________________________________  EKG  I personally interpreted any EKGs ordered by me or triage Sinus tachycardia rate 100 no acute ST elevation or depression normal axis QT 355  ____________________________________________  RADIOLOGY  I reviewed any imaging ordered by me or triage that were performed during my shift and, if possible, patient and/or family made aware of any abnormal findings. ____________________________________________   PROCEDURES  Procedure(s) performed:  None  Critical Care performed: CRITICAL CARE Performed by: Jeanmarie PlantJAMES A MCSHANE   Total critical care time: 35 minutes  Critical care time was exclusive of separately billable procedures and treating other patients.  Critical care was necessary to treat or prevent imminent or life-threatening deterioration.  Critical care was time spent personally by me on the following activities: development of treatment plan with patient and/or surrogate as well as nursing, discussions with consultants, evaluation of patient's response to treatment, examination of patient, obtaining history from patient or surrogate, ordering and performing treatments and interventions, ordering and review of laboratory studies, ordering and review of radiographic studies, pulse oximetry and re-evaluation of patient's condition.   ____________________________________________   INITIAL IMPRESSION / ASSESSMENT AND PLAN / ED COURSE  Pertinent labs & imaging results that were available during my care of the patient were reviewed by me and considered in  my medical decision making (see chart for details).  Patient presents with cachexia, significant dehydration, chronic diffuse pain, and significant hypoglycemia. Patient's sugar was initially 24. We have given him IV dextrose and oral dextrose and it is, appropriately. Patient blood pressure is elevated but likely that is his baseline. We'll give him pain medication and see if that helps and then we will be more aggressive if need be. He has a troponin of 0.17 but is not actively having chest pain. His liver function tests are elevated, this is consistent with prior cirrhosis. His bili is 6.7 which is quite increased over baseline.  ____________________________________________   FINAL CLINICAL IMPRESSION(S) / ED DIAGNOSES  Final diagnoses:  None      This chart was dictated using voice recognition software.  Despite best efforts to proofread,  errors can occur which can  change meaning.     Jeanmarie Plant, MD 11/04/15 1857  Jeanmarie Plant, MD 11/04/15 Windell Moment  Jeanmarie Plant, MD 11/04/15 [phone]

## 2015-11-04 NOTE — ED Notes (Addendum)
Pt to ED from home with n/v, weakness, and hypoglycemia. Per EMS daughter called 911 due to pt's inability to take care of self and lack of appetite recently. Per EMS pt CBG 42 for EMS, upon arrival CBG 24. Pt AAOx3, vitals stable, MD Mcshane at bedside. Pt given cup of OJ on arrival. Pt tolerating well.

## 2015-11-04 NOTE — ED Notes (Addendum)
Pts BP 139/97, CBG 123. French Anaracy, RN on 1A notified of pt WDL. Pt in route to Floor.

## 2015-11-05 DIAGNOSIS — E8809 Other disorders of plasma-protein metabolism, not elsewhere classified: Secondary | ICD-10-CM

## 2015-11-05 DIAGNOSIS — Z79899 Other long term (current) drug therapy: Secondary | ICD-10-CM

## 2015-11-05 DIAGNOSIS — G629 Polyneuropathy, unspecified: Secondary | ICD-10-CM

## 2015-11-05 DIAGNOSIS — E782 Mixed hyperlipidemia: Secondary | ICD-10-CM

## 2015-11-05 DIAGNOSIS — K649 Unspecified hemorrhoids: Secondary | ICD-10-CM

## 2015-11-05 DIAGNOSIS — K7031 Alcoholic cirrhosis of liver with ascites: Secondary | ICD-10-CM

## 2015-11-05 DIAGNOSIS — E119 Type 2 diabetes mellitus without complications: Secondary | ICD-10-CM

## 2015-11-05 DIAGNOSIS — K861 Other chronic pancreatitis: Secondary | ICD-10-CM

## 2015-11-05 DIAGNOSIS — J439 Emphysema, unspecified: Secondary | ICD-10-CM

## 2015-11-05 DIAGNOSIS — J9811 Atelectasis: Secondary | ICD-10-CM

## 2015-11-05 DIAGNOSIS — R112 Nausea with vomiting, unspecified: Secondary | ICD-10-CM

## 2015-11-05 DIAGNOSIS — A419 Sepsis, unspecified organism: Secondary | ICD-10-CM

## 2015-11-05 DIAGNOSIS — K219 Gastro-esophageal reflux disease without esophagitis: Secondary | ICD-10-CM

## 2015-11-05 DIAGNOSIS — K209 Esophagitis, unspecified: Secondary | ICD-10-CM

## 2015-11-05 DIAGNOSIS — I10 Essential (primary) hypertension: Secondary | ICD-10-CM

## 2015-11-05 DIAGNOSIS — K5790 Diverticulosis of intestine, part unspecified, without perforation or abscess without bleeding: Secondary | ICD-10-CM

## 2015-11-05 DIAGNOSIS — F1020 Alcohol dependence, uncomplicated: Secondary | ICD-10-CM

## 2015-11-05 DIAGNOSIS — Z87442 Personal history of urinary calculi: Secondary | ICD-10-CM

## 2015-11-05 DIAGNOSIS — Z515 Encounter for palliative care: Secondary | ICD-10-CM

## 2015-11-05 DIAGNOSIS — B1920 Unspecified viral hepatitis C without hepatic coma: Secondary | ICD-10-CM

## 2015-11-05 DIAGNOSIS — R64 Cachexia: Secondary | ICD-10-CM

## 2015-11-05 DIAGNOSIS — G9340 Encephalopathy, unspecified: Secondary | ICD-10-CM

## 2015-11-05 DIAGNOSIS — N10 Acute tubulo-interstitial nephritis: Secondary | ICD-10-CM

## 2015-11-05 DIAGNOSIS — R109 Unspecified abdominal pain: Secondary | ICD-10-CM | POA: Insufficient documentation

## 2015-11-05 DIAGNOSIS — F17210 Nicotine dependence, cigarettes, uncomplicated: Secondary | ICD-10-CM

## 2015-11-05 DIAGNOSIS — E43 Unspecified severe protein-calorie malnutrition: Secondary | ICD-10-CM

## 2015-11-05 DIAGNOSIS — D696 Thrombocytopenia, unspecified: Secondary | ICD-10-CM

## 2015-11-05 DIAGNOSIS — E162 Hypoglycemia, unspecified: Secondary | ICD-10-CM

## 2015-11-05 LAB — CBC
HCT: 27.3 % — ABNORMAL LOW (ref 40.0–52.0)
Hemoglobin: 9.1 g/dL — ABNORMAL LOW (ref 13.0–18.0)
MCH: 28.8 pg (ref 26.0–34.0)
MCHC: 33.3 g/dL (ref 32.0–36.0)
MCV: 86.3 fL (ref 80.0–100.0)
PLATELETS: 17 10*3/uL — AB (ref 150–440)
RBC: 3.17 MIL/uL — ABNORMAL LOW (ref 4.40–5.90)
RDW: 15.2 % — ABNORMAL HIGH (ref 11.5–14.5)
WBC: 8.3 10*3/uL (ref 3.8–10.6)

## 2015-11-05 LAB — HEPATIC FUNCTION PANEL
ALBUMIN: 1.8 g/dL — AB (ref 3.5–5.0)
ALT: 108 U/L — AB (ref 17–63)
AST: 97 U/L — AB (ref 15–41)
Alkaline Phosphatase: 448 U/L — ABNORMAL HIGH (ref 38–126)
BILIRUBIN INDIRECT: 1.8 mg/dL — AB (ref 0.3–0.9)
Bilirubin, Direct: 3.7 mg/dL — ABNORMAL HIGH (ref 0.1–0.5)
TOTAL PROTEIN: 5.4 g/dL — AB (ref 6.5–8.1)
Total Bilirubin: 5.5 mg/dL — ABNORMAL HIGH (ref 0.3–1.2)

## 2015-11-05 LAB — BLOOD CULTURE ID PANEL (REFLEXED)
ACINETOBACTER BAUMANNII: NOT DETECTED
CANDIDA GLABRATA: NOT DETECTED
CANDIDA KRUSEI: NOT DETECTED
CANDIDA PARAPSILOSIS: NOT DETECTED
CANDIDA TROPICALIS: NOT DETECTED
CARBAPENEM RESISTANCE: NOT DETECTED
Candida albicans: NOT DETECTED
ESCHERICHIA COLI: DETECTED — AB
Enterobacter cloacae complex: NOT DETECTED
Enterobacteriaceae species: DETECTED — AB
Enterococcus species: NOT DETECTED
HAEMOPHILUS INFLUENZAE: NOT DETECTED
KLEBSIELLA OXYTOCA: NOT DETECTED
Klebsiella pneumoniae: NOT DETECTED
LISTERIA MONOCYTOGENES: NOT DETECTED
Methicillin resistance: NOT DETECTED
NEISSERIA MENINGITIDIS: NOT DETECTED
PROTEUS SPECIES: NOT DETECTED
Pseudomonas aeruginosa: NOT DETECTED
SERRATIA MARCESCENS: NOT DETECTED
STAPHYLOCOCCUS SPECIES: NOT DETECTED
STREPTOCOCCUS PYOGENES: NOT DETECTED
STREPTOCOCCUS SPECIES: NOT DETECTED
Staphylococcus aureus (BCID): NOT DETECTED
Streptococcus agalactiae: NOT DETECTED
Streptococcus pneumoniae: NOT DETECTED
Vancomycin resistance: NOT DETECTED

## 2015-11-05 LAB — LACTIC ACID, PLASMA
LACTIC ACID, VENOUS: 3.6 mmol/L — AB (ref 0.5–2.0)
Lactic Acid, Venous: 2.5 mmol/L (ref 0.5–2.0)

## 2015-11-05 LAB — GLUCOSE, CAPILLARY
GLUCOSE-CAPILLARY: 129 mg/dL — AB (ref 65–99)
GLUCOSE-CAPILLARY: 24 mg/dL — AB (ref 65–99)
GLUCOSE-CAPILLARY: 154 mg/dL — AB (ref 65–99)
GLUCOSE-CAPILLARY: 273 mg/dL — AB (ref 65–99)
GLUCOSE-CAPILLARY: 251 mg/dL — AB (ref 65–99)
GLUCOSE-CAPILLARY: 266 mg/dL — AB (ref 65–99)
GLUCOSE-CAPILLARY: 257 mg/dL — AB (ref 65–99)
GLUCOSE-CAPILLARY: 234 mg/dL — AB (ref 65–99)
Glucose-Capillary: 169 mg/dL — ABNORMAL HIGH (ref 65–99)
Glucose-Capillary: 243 mg/dL — ABNORMAL HIGH (ref 65–99)
Glucose-Capillary: 258 mg/dL — ABNORMAL HIGH (ref 65–99)
Glucose-Capillary: 259 mg/dL — ABNORMAL HIGH (ref 65–99)
Glucose-Capillary: 269 mg/dL — ABNORMAL HIGH (ref 65–99)
Glucose-Capillary: 270 mg/dL — ABNORMAL HIGH (ref 65–99)
Glucose-Capillary: 291 mg/dL — ABNORMAL HIGH (ref 65–99)
Glucose-Capillary: <10 mg/dL — CL (ref 65–99)
Glucose-Capillary: <10 mg/dL — CL (ref 65–99)

## 2015-11-05 LAB — BASIC METABOLIC PANEL
Anion gap: 7 (ref 5–15)
BUN: 38 mg/dL — ABNORMAL HIGH (ref 6–20)
CALCIUM: 8 mg/dL — AB (ref 8.9–10.3)
CO2: 25 mmol/L (ref 22–32)
CREATININE: 0.57 mg/dL — AB (ref 0.61–1.24)
Chloride: 107 mmol/L (ref 101–111)
GFR calc Af Amer: >60 mL/min (ref >60)
GFR calc non Af Amer: >60 mL/min (ref >60)
GLUCOSE: 79 mg/dL (ref 65–99)
Potassium: 3.4 mmol/L — ABNORMAL LOW (ref 3.5–5.1)
Sodium: 139 mmol/L (ref 135–145)

## 2015-11-05 LAB — PROTIME-INR
INR: 2.59
Prothrombin Time: 27.4 seconds — ABNORMAL HIGH (ref 11.4–15.0)

## 2015-11-05 LAB — TROPONIN I
TROPONIN I: 0.16 ng/mL — AB (ref <0.031)
Troponin I: 0.44 ng/mL — ABNORMAL HIGH (ref <0.031)
Troponin I: 0.73 ng/mL — ABNORMAL HIGH (ref <0.031)

## 2015-11-05 LAB — CK: Total CK: 92 U/L (ref 49–397)

## 2015-11-05 MED ORDER — DEXTROSE 50 % IV SOLN
INTRAVENOUS | Status: AC
  Administered 2015-11-05: 50 mL
  Filled 2015-11-05: qty 100

## 2015-11-05 MED ORDER — DEXTROSE 50 % IV SOLN
INTRAVENOUS | Status: AC
  Administered 2015-11-05: 50 mL
  Filled 2015-11-05: qty 50

## 2015-11-05 MED ORDER — DEXTROSE 5 % IV SOLN
INTRAVENOUS | Status: DC
  Administered 2015-11-05: 11:00:00 via INTRAVENOUS

## 2015-11-05 MED ORDER — HEPARIN (PORCINE) IN NACL 100-0.45 UNIT/ML-% IJ SOLN
500.0000 [IU]/h | INTRAMUSCULAR | Status: DC
  Filled 2015-11-05: qty 250

## 2015-11-05 MED ORDER — DEXTROSE 50 % IV SOLN
1.0000 | Freq: Once | INTRAVENOUS | Status: AC
  Administered 2015-11-05: 50 mL via INTRAVENOUS

## 2015-11-05 MED ORDER — POTASSIUM CHLORIDE CRYS ER 20 MEQ PO TBCR
40.0000 meq | EXTENDED_RELEASE_TABLET | Freq: Once | ORAL | Status: AC
  Administered 2015-11-05: 40 meq via ORAL
  Filled 2015-11-05: qty 2

## 2015-11-05 MED ORDER — SODIUM CHLORIDE 0.9 % IV SOLN
INTRAVENOUS | Status: DC
  Administered 2015-11-05: 02:00:00 via INTRAVENOUS

## 2015-11-05 MED ORDER — DEXTROSE 10 % IV SOLN
INTRAVENOUS | Status: DC
  Administered 2015-11-05: 08:00:00 via INTRAVENOUS

## 2015-11-05 MED ORDER — PANTOPRAZOLE SODIUM 40 MG IV SOLR
40.0000 mg | INTRAVENOUS | Status: DC
  Administered 2015-11-05 – 2015-11-06 (×2): 40 mg via INTRAVENOUS
  Filled 2015-11-05 (×2): qty 40

## 2015-11-05 MED ORDER — LACTULOSE 10 GM/15ML PO SOLN
20.0000 g | Freq: Every day | ORAL | Status: DC
  Administered 2015-11-05: 20 g via ORAL
  Filled 2015-11-05: qty 30

## 2015-11-05 MED ORDER — DEXTROSE 5 % IV SOLN
2.0000 g | INTRAVENOUS | Status: DC
  Administered 2015-11-05 – 2015-11-07 (×3): 2 g via INTRAVENOUS
  Filled 2015-11-05 (×4): qty 2

## 2015-11-05 MED ORDER — SODIUM CHLORIDE 0.9 % IV BOLUS (SEPSIS)
1000.0000 mL | INTRAVENOUS | Status: DC | PRN
  Administered 2015-11-05: 1000 mL via INTRAVENOUS
  Filled 2015-11-05: qty 1000

## 2015-11-05 MED ORDER — ENSURE ENLIVE PO LIQD
237.0000 mL | Freq: Three times a day (TID) | ORAL | Status: DC
  Administered 2015-11-06 – 2015-11-14 (×21): 237 mL via ORAL

## 2015-11-05 MED ORDER — PHYTONADIONE 5 MG PO TABS
10.0000 mg | ORAL_TABLET | Freq: Once | ORAL | Status: AC
  Administered 2015-11-05: 10 mg via ORAL
  Filled 2015-11-05: qty 2

## 2015-11-05 NOTE — Consult Note (Addendum)
Palliative Medicine Inpatient Consult Note   Name: Ian [name] Date: 11/05/2015 MRN: [id number]  DOB: 08/20/1951  Referring Physician: Alford Highland, MD  Palliative Care consult requested for this 65 y.o. male for goals of medical therapy in patient with   History and Observations:  Pt is 65 yo man who was living at his daughter's home for a month. (We do not know which daughter!)  He was unable to take care of himself and had not eaten in days.  He came in disheveled and smelling of urine. He is cachectic and his labs were positive for cocaine and marijuana and blood cxs. A Ct scan showed findings c/w acute pyelonephritis and Blood Cxs have grown e coli/ GNR in aerobic and anaerobic bottles.  He is cachectic and his albumin is less than 2.  He had mildly elevated troponin labs and a heparin drip was ordered but then quickly stopped (since pts platelets have dropped from 94 to 17 and he is auto-anticoagulated with an INR of 1.9)    He has had malignant HTN and hypoglycemia and electrolyte disturbances.  He is not bleeding though platelets are very low and INR is high.  Risk of bleeding is high. It is not known if he has varices or not.    He has been seen by GI and we know his alcoholic cirrhosis to be severe as he has ascites on imaging and a high INR. His Meld Score is calculated to be 21.  This means he has (just on basis of his liver disease ALONE) a mortality probability of 28% within 90 days.  Given his other comorbid conditions, including his very severe malnutrition, he likely has a much higher likelihood of death within weeks than this MELD score (or his CTP Class C score ) alone can predict.    He has a history of leaving the ED against medical advice.  He may not be complaint with any recommendations made to him or arrangements worked out for him.  However, at this time, it is hard to imagine that he can even walk and he is requiring staff to feed him the full liguid diet that is  ordered.   He had trouble with solids and a previous CT suggested reflux and noted a thickened distal esophagus.  He does not seem to be a good candidate for any kind of EGD procedure (given his low platelets and high INR).  So we may have to continue to conjecture about the etiology of this 'thickened esophagus'.  He may need to be on liquids only going forward.  I visited the pt twice.  I found he knows where he is and the month and the year. He said, "I am not asleep' and he gave me permission to remove the covers from over his head. He did not assist me in doing so.  He did not give me any answers when I brought up code status in a very simple way.  He said he did not know.  He also then said he was thirsty and yet he would not reach out or hold any of the items on his food tray. I was able to hold a glass for him and he drank half a glass of iced tea without any difficulty or coughing etc.  Then, I fed him spoonfulls of the thick soup on the tray (?mushroom soup maybe?). He wanted more but he does take a bit of time to swallow each spoonful.  Not too long  however, and he swallows it entirely. He has to be fed.  His legs are cachectic and drawn up in a contracted state --though his legs are able to be stretched out somewhat.    I cannot imagine him getting up and leaving, but he has done so from the ED before, so perhaps that will happen again.  Also, he is really borderline for Hospice Home --given a somewhat unpredictalble life expectancy.  I would recommend continued pursuit of Medicaid so he can have long term care placement when he stops leaving AMA.  Then, he should have Hospice follow him in the long term care setting as he certainly has less than a 6 month life expectancy based on malnutrition alone. He might benefit from a PPI given his evidence of esophagitis and his high INR and low platelets.    I have talked with Care Mgr and GI and nursing and CNA about pt today.       IMPRESSION: Sepsis due to pyelonephritis --Blood cultures pos for e coli/ GNR in aerobic and anaerobic cxs Acute Pyelonephritis Cirrhosis--alcoholic --with ascites --MELD score 21 and CTP C giving him limited life expectancy --Total bilirubin 6.7 on 3/8 --with coagulopathy (INR 1.9) Severe Thrombocytopenia --platelets are down to 17 (no bleeding as yet) Kidney Stone Atelectasis Chronic Calcific Pancreatitis Severe Hypoglycemia  --Glucose less than 10 today requiring 2 amps of D50  --probably related to depleted glycogen stores, liver dz, and starvation Severe malnutrition and cachexia --related to substance/ alcohol abuse with very poor oral intake of food Hepatitis C ---Was referred to [facility] in [place] but not known if he went there Cocaine Use  --UDS was positive for cocaine at admission (along with cannabinoid) Severe Malnutrition due to substance abuse and limited intake  ---BMI is 14 Possible dysphagia (trouble swallowing solids--SLP to follow) Metabolic Encephalopathy Chronic Pain and chronic Peripheral Neuropathy Elevated Troponins --due to demand ischemia Lactic Acidosis Malignant HTN at admission (BP 203/ 119) Nausea and Vomiting Alcoholism (though alcohol in blood was low at adm --reports he drinks 3 cans beer / week Diverticulosis and Hemorrhois seen on colonoscopy in 2015 H/O Pancreatic dust stones --EUS / ERCP at [facility] 2015 DM2 ---seen in ED on 2/2 with glucose 399 and c/o neuropathy and stated he had no insulin or supplies or meds at home --but pt left AMA Tobacco smoker 0.5 ppd Mild emphysema seen on CTa done 1/26 when pt came to ED  Esophagitis was noted on CTa done 1/26     REVIEW OF SYSTEMS:  Patient is not able to provide ROS due to lethargy  SPIRITUAL SUPPORT SYSTEM: Yes.  SOCIAL HISTORY: Autopopulated social history about his substance use is not considered to be accurate.  Pt smokes tobacco, drinks alcohol, and uses substances .  He was  living with his daughter for the past month.  He came in disheveled and smelled of urine and was jaundiced.  He had not been eating for several weeks at daughter's home.  Report was that he has 'occasional beers'.  Daughter Andi Devon) is in JAIL in [employer] and we have not reached her so far.  Daughter's mother was unable to give attending (Dr Hilton Sinclair) any other contacts.  Pt has another daughter with cognitive impairment.  Two other sons are in prison ([place] and [place] )--locations not known.  Pt has several siblings that we cannot locate Trula Ore, [place], and [place]). Message was left with Maryan Rued by Care Mgr re: needing a way to communicate with  pts daughter.    LEGAL DOCUMENTS:  None  CODE STATUS: Full code  PAST MEDICAL HISTORY: Past Medical History  Diagnosis Date  . Diabetes mellitus   . HTN (hypertension)   . Cirrhosis of liver (HCC)   . Hepatitis C   . Neuropathy (HCC)   . Cirrhosis (HCC)     PAST SURGICAL HISTORY:  Past Surgical History  Procedure Laterality Date  . Mandible surgery    . Dental surgery      teeth extraction    ALLERGIES:  has No Known Allergies.  MEDICATIONS:  Current Facility-Administered Medications  Medication Dose Route Frequency Provider Last Rate Last Dose  . acetaminophen (TYLENOL) tablet 650 mg  650 mg Oral Q6H PRN Enid Baas, MD       Or  . acetaminophen (TYLENOL) suppository 650 mg  650 mg Rectal Q6H PRN Enid Baas, MD      . cefTRIAXone (ROCEPHIN) 2 g in dextrose 5 % 50 mL IVPB  2 g Intravenous Q24H Alford Highland, MD   2 g at 11/05/15 1316  . cloNIDine (CATAPRES - Dosed in mg/24 hr) patch 0.1 mg  0.1 mg Transdermal Weekly Enid Baas, MD   0.1 mg at 11/05/15 0004  . dextrose 5 % solution   Intravenous Continuous Alford Highland, MD 20 mL/hr at 11/05/15 1532    . feeding supplement (ENSURE ENLIVE) (ENSURE ENLIVE) liquid 237 mL  237 mL Oral TID WC Alford Highland, MD      . hydrALAZINE (APRESOLINE) injection 10  mg  10 mg Intravenous Q6H PRN Enid Baas, MD      . lactulose (CHRONULAC) 10 GM/15ML solution 20 g  20 g Oral Daily Christiane Lexine Baton, NP      . morphine 2 MG/ML injection 2 mg  2 mg Intravenous Q4H PRN Enid Baas, MD   2 mg at 11/05/15 1323  . ondansetron (ZOFRAN) tablet 4 mg  4 mg Oral Q6H PRN Enid Baas, MD       Or  . ondansetron (ZOFRAN) injection 4 mg  4 mg Intravenous Q6H PRN Enid Baas, MD      . potassium chloride SA (K-DUR,KLOR-CON) CR tablet 40 mEq  40 mEq Oral Once Alford Highland, MD      . sodium chloride 0.9 % bolus 1,000 mL  1,000 mL Intravenous PRN Oralia Manis, MD   1,000 mL at 11/05/15 0002  . sodium chloride flush (NS) 0.9 % injection 3 mL  3 mL Intravenous Q12H Enid Baas, MD   3 mL at 11/05/15 0826    Vital Signs: BP 144/100 mmHg  Pulse 86  Temp(Src) 97.8 F (36.6 C) (Oral)  Resp 14  Ht 5\' 7"  (1.702 m)  Wt 40.824 kg (90 lb)  BMI 14.09 kg/m2  SpO2 100% Filed Weights   11/04/15 1734  Weight: 40.824 kg (90 lb)    Estimated body mass index is 14.09 kg/(m^2) as calculated from the following:   Height as of this encounter: 5\' 7"  (1.702 m).   Weight as of this encounter: 40.824 kg (90 lb).  PERFORMANCE STATUS (ECOG) : 3 - Symptomatic, >50% confined to bed  PHYSICAL EXAM: Lying in medical bed with covers over his head --does not want to talk --and turns away and places covers back  Jaundiced Icteric sclera No JVD or TM Hrt rrr no m Lungs decreased BS bases Abd protuberant but NT Ext no mottling or cyanosis  LABS: CBC:    Component Value Date/Time   WBC 8.3 11/05/2015  0250   WBC 3.9 08/06/2014 1327   HGB 9.1* 11/05/2015 0250   HGB 12.6* 08/06/2014 1327   HCT 27.3* 11/05/2015 0250   HCT 38.6* 08/06/2014 1327   PLT 17* 11/05/2015 0250   PLT 88* 08/06/2014 1327   MCV 86.3 11/05/2015 0250   MCV 100 08/06/2014 1327   NEUTROABS 9.2* 11/04/2015 1737   NEUTROABS 1.5 05/07/2014 0955   LYMPHSABS 0.5* 11/04/2015  1737   LYMPHSABS 2.3 05/07/2014 0955   MONOABS 1.2* 11/04/2015 1737   MONOABS 0.2 05/07/2014 0955   EOSABS 0.0 11/04/2015 1737   EOSABS 0.0 05/07/2014 0955   BASOSABS 0.0 11/04/2015 1737   BASOSABS 0.0 05/07/2014 0955   Comprehensive Metabolic Panel:    Component Value Date/Time   NA 139 11/05/2015 0250   NA 133* 08/06/2014 1327   K 3.4* 11/05/2015 0250   K 3.6 08/06/2014 1327   CL 107 11/05/2015 0250   CL 97* 08/06/2014 1327   CO2 25 11/05/2015 0250   CO2 31 08/06/2014 1327   BUN 38* 11/05/2015 0250   BUN 7 08/06/2014 1327   CREATININE 0.57* 11/05/2015 0250   CREATININE 1.07 08/06/2014 1327   GLUCOSE 79 11/05/2015 0250   GLUCOSE 445* 08/06/2014 1327   CALCIUM 8.0* 11/05/2015 0250   CALCIUM 8.1* 08/06/2014 1327   AST 333* 11/04/2015 1737   AST 86* 08/06/2014 1327   ALT 185* 11/04/2015 1737   ALT 92* 08/06/2014 1327   ALKPHOS 706* 11/04/2015 1737   ALKPHOS 139* 08/06/2014 1327   BILITOT 6.7* 11/04/2015 1737   BILITOT 1.0 08/06/2014 1327   PROT 6.3* 11/04/2015 1737   PROT 7.2 08/06/2014 1327   ALBUMIN 2.0* 11/04/2015 1737   ALBUMIN 2.9* 08/06/2014 1327   CT chest , Abdomen, Pelvis 11/04/15: There is no pleural or pericardial effusion. Heart size is normal. There is some mild atelectasis or scar in the left lung base. The patient is cachectic. There is diffuse body wall and mesenteric edema and some abdominal and pelvic ascites. No drainable collection is seen. A 0.6 cm nonobstructing stone is seen in the midpole of the right kidney. Contrast excretion from the right kidney is delayed and there are areas of cortical hypoattenuation on delayed imaging. No obstructing stone is seen. The urinary bladder is unremarkable. The spleen and adrenal glands appear normal. Chronic pancreatic ductal dilatation with extensive calcifications throughout the pancreas consistent with chronic pancreatitis appear unchanged. There is a small low attenuating lesion in the left hepatic lobe  on image 12 is unchanged. The liver is otherwise unremarkable. The gallbladder appears normal. Small bowel loops are largely decompressed but otherwise unremarkable. The colon and stomach appear normal. The appendix is not visualized but no focal inflammatory process is seen. Aortoiliac atherosclerosis without aneurysm is identified. No focal bony abnormality is seen. IMPRESSION: Cachectic appearing patient with diffuse body wall and mesenteric edema consistent with anasarca.Abnormal enhancement pattern right kidney is most worrisome for pyelonephritis.Nonobstructing stone midpole right kidney. Chronic calcific pancreatitis. Atherosclerosis.  CTa chest 1/26: IMPRESSION: 1. No evidence of pulmonary embolus. 2. No mediastinal hematoma or adenopathy. 3. There is gas and some layering debris within esophagus highly suspicious for gastroesophageal reflux. Again noted thickening of distal esophageal wall suspicious for reflux esophagitis. Clinical correlation is necessary. 4. Mild emphysematous changes bilateral upper lobe and apex. Mild emphysematous changes left lower lobe posterolaterally. 5. No acute infiltrate or pulmonary edema. 6. 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 Again noted significant dilatation of main pancreatic duct without change from  prior exam   More than 50% of the visit was spent in counseling/coordination of care: Yes  Time Spent: 80 minutes

## 2015-11-05 NOTE — Progress Notes (Signed)
Patient has been alert after CBG improved. Not oriented to situation or time. Does answer questions appropriately, but states he cannot remember certain things. Unaware if patient can swallow pills with water since he states he doesn't know if he takes pills at home. Swallows clears very well and did well with crushed pills in applesauce. Speech therapy will by by to see patient tomorrow morning. Blood sugars have been in the 250's to 260's and patient is now being checked every 2 hours with D5 going at 3720mL/hr, per Dr. Mathews RobinsonsWieting's order. Family was at bedside most of the day.

## 2015-11-05 NOTE — Progress Notes (Signed)
Patient ID: Ian PellantBobby L Capri, male   DOB: February 08, 1951, 65 y.o.   MRN: [id number] [facility] PROGRESS NOTE  Joette CatchingBobby L Schnetzer NFA:[id number] DOB: February 08, 1951 DOA: 11/04/2015 PCP: Rico JunkerHANNE, [name], PA  HPI/Subjective: Called urgently on sugar less than 10. Patient was less responsive. After pushing 2 amps of dextrose, he responded and stated he had to urinate.  Objective: Filed Vitals:   11/05/15 0452 11/05/15 0807  BP: 107/69 119/73  Pulse: 115 105  Temp: 97.9 F (36.6 C) 97.3 F (36.3 C)  Resp: 18 12    Intake/Output Summary (Last 24 hours) at 11/05/15 0825 Last data filed at 11/05/15 0817  Gross per 24 hour  Intake 5473.33 ml  Output    202 ml  Net 5271.33 ml   Filed Weights   11/04/15 1734  Weight: 40.824 kg (90 lb)    ROS: Review of Systems  Unable to perform ROS acute encephalopathy Exam: Physical Exam  Constitutional: He appears lethargic. He appears cachectic.  HENT:  Nose: No mucosal edema.  Mouth/Throat: No oropharyngeal exudate or posterior oropharyngeal edema.  Dry, thrush  Eyes: Lids are normal. Pupils are equal, round, and reactive to light.  Jaundice  Neck: No JVD present. Carotid bruit is not present. No edema present. No thyroid mass and no thyromegaly present.  Cardiovascular: S1 normal and S2 normal.  Tachycardia present.  Exam reveals no gallop.   No murmur heard. Pulses:      Dorsalis pedis pulses are 2+ on the right side, and 2+ on the left side.  Respiratory: No respiratory distress. He has no wheezes. He has no rhonchi. He has no rales.  GI: Soft. Bowel sounds are normal. There is no tenderness.  Musculoskeletal:       Right ankle: He exhibits no swelling.       Left ankle: He exhibits no swelling.  Lymphadenopathy:    He has no cervical adenopathy.  Neurological: He appears lethargic.  Able to move extremities  Skin: Skin is warm. Nails show no clubbing.  Psychiatric:  Altered and could not assess      Data Reviewed: Basic  Metabolic Panel:  Recent Labs Lab 11/04/15 1737 11/05/15 0250  NA 137 139  K 3.4* 3.4*  CL 100* 107  CO2 29 25  GLUCOSE 55* 79  BUN 42* 38*  CREATININE 0.63 0.57*  CALCIUM 8.4* 8.0*   Liver Function Tests:  Recent Labs Lab 11/04/15 1737  AST 333*  ALT 185*  ALKPHOS 706*  BILITOT 6.7*  PROT 6.3*  ALBUMIN 2.0*    Recent Labs Lab 11/04/15 1737  LIPASE <10*    Recent Labs Lab 11/04/15 1737  AMMONIA <9*   CBC:  Recent Labs Lab 11/04/15 1737 11/05/15 0250  WBC 10.9* 8.3  NEUTROABS 9.2*  --   HGB 9.9* 9.1*  HCT 29.8* 27.3*  MCV 85.1 86.3  PLT 22* 17*   Cardiac Enzymes:  Recent Labs Lab 11/04/15 1737 11/04/15 2319 11/05/15 0250 11/05/15 0630  TROPONINI 0.17* 0.16* 0.44* 0.73*    CBG:  Recent Labs Lab 11/04/15 2123 11/05/15 0801 11/05/15 0803  GLUCAP 123* <10* <10*      Studies: Ct Abdomen Pelvis W Contrast  11/04/2015  CLINICAL DATA:  Chronic abdominal pain. History of cirrhosis. Initial encounter. EXAM: CT ABDOMEN AND PELVIS WITH CONTRAST TECHNIQUE: Multidetector CT imaging of the abdomen and pelvis was performed using the standard protocol following bolus administration of intravenous contrast. CONTRAST:  75 ml OMNIPAQUE IOHEXOL 300 MG/ML  SOLN COMPARISON:  CT chest, abdomen and pelvis 06/23/2014. FINDINGS: There is no pleural or pericardial effusion. Heart size is normal. There is some mild atelectasis or scar in the left lung base. The patient is cachectic. There is diffuse body wall and mesenteric edema and some abdominal and pelvic ascites. No drainable collection is seen. A 0.6 cm nonobstructing stone is seen in the midpole of the right kidney. Contrast excretion from the right kidney is delayed and there are areas of cortical hypoattenuation on delayed imaging. No obstructing stone is seen. The urinary bladder is unremarkable. The spleen and adrenal glands appear normal. Chronic pancreatic ductal dilatation with extensive calcifications  throughout the pancreas consistent with chronic pancreatitis appear unchanged. There is a small low attenuating lesion in the left hepatic lobe on image 12 is unchanged. The liver is otherwise unremarkable. The gallbladder appears normal. Small bowel loops are largely decompressed but otherwise unremarkable. The colon and stomach appear normal. The appendix is not visualized but no focal inflammatory process is seen. Aortoiliac atherosclerosis without aneurysm is identified. No focal bony abnormality is seen. IMPRESSION: Cachectic appearing patient with diffuse body wall and mesenteric edema consistent with anasarca. Abnormal enhancement pattern right kidney is most worrisome for pyelonephritis. Nonobstructing stone midpole right kidney. Chronic calcific pancreatitis. Atherosclerosis. Electronically Signed   By: Drusilla Kanner M.D.   On: 11/04/2015 20:35    Scheduled Meds: . cloNIDine  0.1 mg Transdermal Weekly  . dextrose  1 ampule Intravenous Once  . sodium chloride flush  3 mL Intravenous Q12H   Continuous Infusions: . dextrose      Assessment/Plan:  1.  Severe hypoglycemia with liver failure, acute encephalopathy. Push 3 amps D50. Start d10 drip and check sugars q1 2.  Alcoholic cirrhosis, increased liver function test, jaundice, thrombocytopenia, coagulopathy, cachexia. Overall prognosis is poor. Patient is a full code. Daughter is in jail and unable to reach at this time. Spoke with the daughter's mother and she was unable to give me any other contacts. 3. History of hypertension on clonidine patch 4. Acute cystitis with hematuria on Rocephin and follow-up cultures 5. Elevated troponin likely demand ischemia. Would not give heparin drip for blood thinners at this time secondary to platelets being 17 and patient auto anticoagulated. 6. History of hepatitis C 7. History of neuropathy 8. Overall prognosis is very poor. Out of care consultation placed  Code Status:     Code Status  Orders        Start     Ordered   11/04/15 2157  Full code   Continuous     11/04/15 2156    Code Status History    Date Active Date Inactive Code Status Order ID Comments User Context   This patient has a current code status but no historical code status.     Family Communication: Spoke with the daughter's mother and she states that the daughter is in jail. Disposition Plan: To be determined  Consultants:  Palliative care  Antibiotics:  Rocephin  Time spent: 30 minutes critical care time. Severely hypoglycemic with altered mental status  Alford Highland  [facility]

## 2015-11-05 NOTE — Evaluation (Signed)
Physical Therapy Evaluation Patient Details Name: Ian PellantBobby L [name] MRN: [id number] DOB: [birth year] Today's Date: 11/05/2015   History of Present Illness  presented to ER seconadry to weakness, hypoglycemia, failure to thrive; admitted with jaundice, liver failure and hypoglycemia.  PMH significant for DM, HTN, cirrhosis, neuropathy and hep C  Clinical Impression  Upon evaluation, patient alert and oriented to basic information; generally agitated with limited participation with formal evaluation components.  Patient very cachetic and frail appearing.  Globally weak and deconditioned, with significant pain reports during all mobility efforts (unable/unwilling to formally quantify or describe beyond "everywhere").  Currently requiring max/total assist +1-2 for all bed mobility efforts and attempts at sit/stand with RW.  Partially clears buttocks from bed surface, but unable to achieve full standing position.  Refused further mobility and insisted upon return to supine. Would benefit from skilled PT to address above deficits and promote optimal return to PLOF; recommend transition to STR upon discharge from acute hospitalization IF patient demonstrates improved ability to participate/progress with skilled intervention (may be more appropriate for LTC).  Will closely monitor progress/participation during remaining hospitalization.     Follow Up Recommendations SNF (if patient able to demonstrate consistent participation/progress with skilled PT services)    Equipment Recommendations  Rolling walker with 5" wheels    Recommendations for Other Services       Precautions / Restrictions Precautions Precautions: Fall Restrictions Weight Bearing Restrictions: No      Mobility  Bed Mobility Overal bed mobility: Needs Assistance Bed Mobility: Supine to Sit;Sit to Supine     Supine to sit: Mod assist;Max assist Sit to supine: Mod assist;+2 for physical assistance      Transfers Overall  transfer level: Needs assistance Equipment used: Rolling walker (2 wheeled) Transfers: Sit to/from Stand Sit to Stand: Max assist;Total assist         General transfer comment: max/total assist for partial lift off from bed surface.  Unable/refused further attempts; progressively agitated with increased encouragement.  Ambulation/Gait             General Gait Details: Unsafe/unable  Stairs            Wheelchair Mobility    Modified Rankin (Stroke Patients Only)       Balance Overall balance assessment: Needs assistance Sitting-balance support: No upper extremity supported;Feet supported Sitting balance-Leahy Scale: Fair Sitting balance - Comments: constantly attempting return to supine                                     Pertinent Vitals/Pain Pain Assessment: Faces Faces Pain Scale: Hurts whole lot Pain Location: generalized soreness throughout, esp R foot/ankle Pain Descriptors / Indicators: Sore Pain Intervention(s): Limited activity within patient's tolerance;Monitored during session;Repositioned    Home Living Family/patient expects to be discharged to:: Skilled nursing facility                 Additional Comments: Patient/family unable to provide accurate information, as unsure of patient's prior living situation    Prior Function           Comments: Patient/family unable to provide detail     Hand Dominance        Extremity/Trunk Assessment   Upper Extremity Assessment: Generalized weakness           Lower Extremity Assessment: Generalized weakness (very cachetic, strength grossly 3-/5; globally weak and deconditioned)  Communication   Communication: No difficulties  Cognition Arousal/Alertness: Awake/alert Behavior During Therapy: Agitated Overall Cognitive Status: Difficult to assess (oriented to self, location and month; generally confused with limited command-following.  Generally agitated with  limited cooperation/participation)                      General Comments      Exercises        Assessment/Plan    PT Assessment Patient needs continued PT services  PT Diagnosis Difficulty walking;Generalized weakness;Acute pain   PT Problem List Decreased strength;Decreased range of motion;Decreased activity tolerance;Decreased balance;Decreased mobility;Decreased cognition;Decreased knowledge of use of DME;Decreased safety awareness;Decreased knowledge of precautions;Pain  PT Treatment Interventions DME instruction;Gait training;Stair training;Functional mobility training;Therapeutic activities;Therapeutic exercise;Balance training;Patient/family education   PT Goals (Current goals can be found in the Care Plan section) Acute Rehab PT Goals PT Goal Formulation: Patient unable to participate in goal setting Time For Goal Achievement: 11/19/15 Potential to Achieve Goals: Fair    Frequency Min 2X/week   Barriers to discharge Decreased caregiver support;Inaccessible home environment      Co-evaluation               End of Session Equipment Utilized During Treatment: Gait belt Activity Tolerance: Treatment limited secondary to agitation Patient left: in bed;with call bell/phone within reach;with bed alarm set Nurse Communication: Mobility status         Time: 4098-1191 PT Time Calculation (min) (ACUTE ONLY): 15 min   Charges:   PT Evaluation $PT Eval Moderate Complexity: 1 Procedure     PT G Codes:       Ian [name] H. Manson Passey, PT, DPT, NCS 11/05/2015, 4:34 PM [phone]

## 2015-11-05 NOTE — Consult Note (Signed)
Consultation  Referring Provider:     Dr Renae Gloss Admit date: 11/04/15 Consult date   11/05/15      Reason for Consultation:    Cirrhosis/jaundice          HPI:   Ian [name] is a 65 y.o. male history of diabetes mellitus, hypertension, alcoholic/HCV liver cirrhosis, alchohol induced pancreatitis/severe EPI, chronic peripheral neuropathy, admitted yesterday with FTT/jaundice/urosepsis with gram negative rods/ecoli in bot aerobic and anaerobic blood cultures. Positive for both cocaine and marijuana. Evidently still drinking ETOH, but etoh level undetectable yesterday. BMI 14.  Palliative care has been consulted due to overall poor prognosis. Has been having hypoglycemia requiring D50, has been placed on D10 drip.  Has significant thrombocytopenia. Lactic acid and troponin levels elevated- felt to be due to demand ischemia. PT elevated at 21.7, Inr 1.9.  ALP 706, AST 333, ALT 185, Total bili 6.7.  CT abdomen/pelvis with anasarca and ascites, right sided pyelonephritis, chronic calcified pancreatitis and ductal dilation, stable left hepatic lobe lesion from 2015. PREVIOUS ENDOSCOPIES:            EUS/ERCP 2015by Dr Shana Chute at [facility] for dilated pancreatic duct/ductal stones- cannot access this report HCV 2015 [facility] however requested records to see [facility] in [place].  EGD and colonoscopy by Dr Shelle Iron 2015 normal with the exception of diverticulosis and hemorrhoids.  He is a poor historian- says he has some pain but unable to state where or describe further, chart indicates is it generalized. He has no specific GI complaints, unable to say if was seen by [facility] for his HCV.     Past Medical History  Diagnosis Date  . Diabetes mellitus   . HTN (hypertension)   . Cirrhosis of liver (HCC)   . Hepatitis C   . Neuropathy (HCC)   . Cirrhosis [facility])     Past Surgical History  Procedure Laterality Date  . Mandible surgery    . Dental surgery      teeth extraction    History reviewed. No pertinent  family history.   Social History  Substance Use Topics  . Smoking status: Current Every Day Smoker -- 0.50 packs/day    Types: Cigarettes  . Smokeless tobacco: Never Used  . Alcohol Use: 1.8 oz/week    3 Cans of beer per week    Prior to Admission medications   Not on File    Current Facility-Administered Medications  Medication Dose Route Frequency Provider Last Rate Last Dose  . acetaminophen (TYLENOL) tablet 650 mg  650 mg Oral Q6H PRN Enid Baas, MD       Or  . acetaminophen (TYLENOL) suppository 650 mg  650 mg Rectal Q6H PRN Enid Baas, MD      . cefTRIAXone (ROCEPHIN) 2 g in dextrose 5 % 50 mL IVPB  2 g Intravenous Q24H Alford Highland, MD   2 g at 11/05/15 1316  . cloNIDine (CATAPRES - Dosed in mg/24 hr) patch 0.1 mg  0.1 mg Transdermal Weekly Enid Baas, MD   0.1 mg at 11/05/15 0004  . dextrose 5 % solution   Intravenous Continuous Alford Highland, MD 50 mL/hr at 11/05/15 1114    . feeding supplement (ENSURE ENLIVE) (ENSURE ENLIVE) liquid 237 mL  237 mL Oral TID WC Alford Highland, MD      . hydrALAZINE (APRESOLINE) injection 10 mg  10 mg Intravenous Q6H PRN Enid Baas, MD      . morphine 2 MG/ML injection 2 mg  2 mg  Intravenous Q4H PRN Enid Baasadhika Kalisetti, MD   2 mg at 11/05/15 1323  . ondansetron (ZOFRAN) tablet 4 mg  4 mg Oral Q6H PRN Enid Baasadhika Kalisetti, MD       Or  . ondansetron (ZOFRAN) injection 4 mg  4 mg Intravenous Q6H PRN Enid Baasadhika Kalisetti, MD      . potassium chloride SA (K-DUR,KLOR-CON) CR tablet 40 mEq  40 mEq Oral Once Alford Highlandichard Wieting, MD      . sodium chloride 0.9 % bolus 1,000 mL  1,000 mL Intravenous PRN Oralia Manisavid Willis, MD   1,000 mL at 11/05/15 0002  . sodium chloride flush (NS) 0.9 % injection 3 mL  3 mL Intravenous Q12H Enid Baasadhika Kalisetti, MD   3 mL at 11/05/15 0826    Allergies as of 11/04/2015  . (No Known Allergies)     Review of Systems:    Unable to perform due to AMS.    Physical Exam:  Vital signs in last 24  hours: Temp:  [96.9 F (36.1 C)-97.9 F (36.6 C)] 97.8 F (36.6 C) (03/09 1134) Pulse Rate:  [62-116] 86 (03/09 1134) Resp:  [11-19] 14 (03/09 1134) BP: (107-203)/(69-124) 144/100 mmHg (03/09 1134) SpO2:  [100 %] 100 % (03/09 1134) Weight:  [40.824 kg (90 lb)] 40.824 kg (90 lb) (03/08 1734) Last BM Date: 11/04/15 General:   Pleasant, drowsy, cachectic in NAD Head:  Normocephalic and atraumatic. Eyes:   Scleral icterus.   Conjunctiva pink. Ears:  Normal auditory acuity. Mouth: Mucosa pink moist, no lesions. edentulous Neck:  Supple; no masses felt Lungs:  Respirations even and unlabored. Lungs clear to auscultation bilaterally.   No wheezes, crackles, or rhonchi.  Heart:  S1S2, RRR, no MRG. No edema. Abdomen:   Flat, soft, nondistended, nontender. Normal bowel sounds. No appreciable masses or hepatomegaly. No rebound signs or other peritoneal signs.  Msk:  MAEW x4, No clubbing or cyanosis.  Symmetrical without gross deformities. Neurologic:  Awakens to verbal. Falls asleep during exam. Cranial nerves  II-XII intact. Generalized weakness, does not follow all commands.  Skin:  Jaundiced. Warm, dry, some purpura no rashes. Psych:  Drowsy, sleepy at time during exam, calm.  LAB RESULTS:  Recent Labs  11/04/15 1737 11/05/15 0250  WBC 10.9* 8.3  HGB 9.9* 9.1*  HCT 29.8* 27.3*  PLT 22* 17*   BMET  Recent Labs  11/04/15 1737 11/05/15 0250  NA 137 139  K 3.4* 3.4*  CL 100* 107  CO2 29 25  GLUCOSE 55* 79  BUN 42* 38*  CREATININE 0.63 0.57*  CALCIUM 8.4* 8.0*   LFT  Recent Labs  11/04/15 1737  PROT 6.3*  ALBUMIN 2.0*  AST 333*  ALT 185*  ALKPHOS 706*  BILITOT 6.7*   PT/INR  Recent Labs  11/04/15 1737  LABPROT 21.7*  INR 1.90    STUDIES: Ct Abdomen Pelvis W Contrast  11/04/2015  CLINICAL DATA:  Chronic abdominal pain. History of cirrhosis. Initial encounter. EXAM: CT ABDOMEN AND PELVIS WITH CONTRAST TECHNIQUE: Multidetector CT imaging of the abdomen and  pelvis was performed using the standard protocol following bolus administration of intravenous contrast. CONTRAST:  75 ml OMNIPAQUE IOHEXOL 300 MG/ML  SOLN COMPARISON:  CT chest, abdomen and pelvis 06/23/2014. FINDINGS: There is no pleural or pericardial effusion. Heart size is normal. There is some mild atelectasis or scar in the left lung base. The patient is cachectic. There is diffuse body wall and mesenteric edema and some abdominal and pelvic ascites. No drainable collection is seen. A  0.6 cm nonobstructing stone is seen in the midpole of the right kidney. Contrast excretion from the right kidney is delayed and there are areas of cortical hypoattenuation on delayed imaging. No obstructing stone is seen. The urinary bladder is unremarkable. The spleen and adrenal glands appear normal. Chronic pancreatic ductal dilatation with extensive calcifications throughout the pancreas consistent with chronic pancreatitis appear unchanged. There is a small low attenuating lesion in the left hepatic lobe on image 12 is unchanged. The liver is otherwise unremarkable. The gallbladder appears normal. Small bowel loops are largely decompressed but otherwise unremarkable. The colon and stomach appear normal. The appendix is not visualized but no focal inflammatory process is seen. Aortoiliac atherosclerosis without aneurysm is identified. No focal bony abnormality is seen. IMPRESSION: Cachectic appearing patient with diffuse body wall and mesenteric edema consistent with anasarca. Abnormal enhancement pattern right kidney is most worrisome for pyelonephritis. Nonobstructing stone midpole right kidney. Chronic calcific pancreatitis. Atherosclerosis. Electronically Signed   By: Drusilla Kanner M.D.   On: 11/04/2015 20:35       Impression / Plan:   1.History of previous Cirrhosis (HCV/substances), CTP class C, MELD 21. Agree with palliative care consult to discuss goals and care plan. May benefit from some platelets. For  now, qd liver panel and pt/inr. Agree with abx. Avoid liver harming substances as much as possible. Will discuss further with Dr Willeen Niece.  2. Chronic pancreatitis.this likely contributes to his weight loss.  Thank you very much for this consult. These services were provided by Vevelyn Pat, NP-C, in collaboration with Christena Deem, MD, with whom I have discussed this patient in full.   Vevelyn Pat, NP-C  Addendum- in further discussion and review, did find evidence of cirrhosis dating back to 2013 on CT- there was no hepatic lesion then. lfts have been up somewhat over the last several months with significant increase this visit.thrombocytopenia dates back at least to 2015. Will assess afp and repeat ammonia, assess ck level given his recent cocaine history as well. Would also reccommend considering echo as well due this.

## 2015-11-05 NOTE — Progress Notes (Signed)
Notified Dr. Renae GlossWieting of E.coli and gram negative rods in blood cultures. MD is already aware and ordering antibiotics. Also spoke to MD about patient's blood sugars coming up to the 270's. MD stated to change to D5 and put in a speech consult for swallowing eval. Patient states he has trouble swallowing solid foods. Will continue to monitor.

## 2015-11-05 NOTE — Progress Notes (Signed)
Dr Anne HahnWillis notified of critical lactic acid of 3.6

## 2015-11-05 NOTE — Progress Notes (Signed)
Inpatient Diabetes Program Recommendations  AACE/ADA: New Consensus Statement on Inpatient Glycemic Control (2015)  Target Ranges:  Prepandial:   less than 140 mg/dL      Peak postprandial:   less than 180 mg/dL (1-2 hours)      Critically ill patients:  140 - 180 mg/dL  Results for Ian [name], Ian [name] (MRN [id number]) as of 11/05/2015 13:04  Ref. Range 11/05/2015 08:37 11/05/2015 09:32 11/05/2015 10:34 11/05/2015 11:32 11/05/2015 12:29  Glucose-Capillary Latest Ref Range: 65-99 mg/dL 409269 (H) 811270 (H) 914273 (H) 251 (H) 243 (H)  Results for Ian [name], Ian [name] (MRN [id number]) as of 11/05/2015 13:04  Ref. Range 11/04/2015 17:37  Glucose Latest Ref Range: 65-99 mg/dL 55 ([name])   Results for Ian [name], Ian [name] (MRN [id number]) as of 11/05/2015 13:04  Ref. Range 10/01/2015 11:11  Glucose Latest Ref Range: 65-99 mg/dL 629362 (H)   Review of Glycemic Control  Diabetes history: DM2 Outpatient Diabetes medications: Unknown Current orders for Inpatient glycemic control: None  Inpatient Diabetes Program Recommendations: Correction (SSI): Please consider ordering CBGs with Novolog correction scale Q4H. HgbA1C: Please consider ordering an A1C to evaluate glycemic control over the past 2-3 months.  NOTE: In reviewing the chart, noted an office note in [facility]ARE EVERY[facility] tab that patient was seen by Dr. Laurel DimmerGarris [facility] doctor on 08/08/2014 and patient was prescribed Levemir 10 units QHS at that time. Also noted that patient presented [facility] ED (sent by PCP) on 10/01/2015 with hyperglycemia (Glucose 362 mg/dl) and patient signed out AMA from the ED.  Thanks, Orlando PennerMarie Shaun Runyon, RN, MSN, CDE Diabetes Coordinator Inpatient Diabetes Program [phone] (Team Pager from 8am to 5pm) [phone] ([facility] office) [phone] [facility] office) [phone] [facility] office)

## 2015-11-05 NOTE — Care Management (Signed)
spoke with attending.  Patient is confused and is not able to make medical decisions.  Patient's daughter, Andi DevonMyicia,  is currently in the [place].  Spoke with Myicia's mother Ericka PontiffFelicia Clark- who is no blood relation to patient other than patient and she are the parents of Myicia. Sunny SchleinFelicia says that the patient has another daughter who is "65% mentally retarded" and tow other sons who are in prison.  She thinks their names are Meyer CoryMarlow and Salley ScarletBobby, Jr.  Does not know of their location.  Patient has several siblings- Trula OreChristina, Lennox GrumblesRudy and Vena Austrialeanor but she does not know their last names.  [facility] [place] [facility] and confirmed that Andi DevonMyicia is a resident and have left voicemail message with the captain regarding arranging method of communication with Myicia regarding care decisions for her Dad.  Patient is currently full code.  Palliative care consult pending.

## 2015-11-05 NOTE — Progress Notes (Signed)
Pts. Lactic acid is 2.5 now down from 3.6. Dr.Diamond notified and no new orders received.

## 2015-11-05 NOTE — Progress Notes (Signed)
CBG resulted less than 10 twice this AM. Dr. Renae GlossWieting notified and MD stated to give 3 amps of D50 and to start patient on dextrose 10% at 4950mL/hr. Medications complete and CBG has resulted at 269. Patient will be checked every hour now. Patient is more alert now and is drinking juice on his clear liquid tray. Will closely monitor.

## 2015-11-05 NOTE — Progress Notes (Signed)
Initial Nutrition Assessment  DOCUMENTATION CODES:   Severe malnutrition in context of chronic illness  INTERVENTION:   Medical Food Supplement Therapy: pt likes Ensure; recommend Ensure Enlive po BID, each supplement provides 350 kcal and 20 grams of protein. Also recommend trial of Magic Cup BID Meals and Snacks: Cater to patient preferences Medical Food Supplement Therapy: pt may benefit from smaller, more frequent meals   NUTRITION DIAGNOSIS:   Malnutrition related to chronic illness as evidenced by severe depletion of body fat, severe depletion of muscle mass.  GOAL:   Patient will meet greater than or equal to 90% of their needs  MONITOR:   PO intake, Supplement acceptance, Weight trends, Labs, Diet advancement  REASON FOR ASSESSMENT:   Consult Assessment of nutrition requirement/status  ASSESSMENT:    Pt admitted with weakness, nausea and hypogylcemia with sepsis, EtOH liver cirrhosis with jaundice; noted palliative care consult pending  Past Medical History  Diagnosis Date  . Diabetes mellitus   . HTN (hypertension)   . Cirrhosis of liver (HCC)   . Hepatitis C   . Neuropathy (HCC)   . Cirrhosis [facility](HCC)    Past Surgical History  Procedure Laterality Date  . Mandible surgery    . Dental surgery      teeth extraction    Diet Order:  Diet full liquid Room service appropriate?: Yes; Fluid consistency:: Thin ; SLP consulted   Energy Intake: pt ate jello, multiple popsicles and broth at lunch today  Food and nutrition Related history: family reports pt with poor appetite and difficulty swallowing; pt can only tolerate soft foods like mashed potatoes with gravy, applesauce, ice cream and yogurt. Family reports that he eats only "baby sized" portions and only eats when hungry which could be anywhere from 1-2 times per day or more. Pt like Ensure but does not drink consistently; noted per MD note, pt occasionally drinking beer  Skin:  Reviewed, no  issues   Recent Labs Lab 11/04/15 1737 11/05/15 0250  NA 137 139  K 3.4* 3.4*  CL 100* 107  CO2 29 25  BUN 42* 38*  CREATININE 0.63 0.57*  CALCIUM 8.4* 8.0*  GLUCOSE 55* 79    Nutrition Focused Physical Exam: Nutrition-Focused physical exam completed. Findings are severe fat depletion, severe muscle depletion, and no edema.   Height:   Ht Readings from Last 1 Encounters:  11/04/15 5\' 7"  (1.702 m)    Weight: family reports significant wt loss but unable to report how much  Wt Readings from Last 1 Encounters:  11/04/15 90 lb (40.824 kg)   Wt Readings from Last 10 Encounters:  11/04/15 90 lb (40.824 kg)  10/01/15 95 lb (43.092 kg)  09/24/15 94 lb (42.638 kg)  02/05/15 100 lb (45.36 kg)  11/25/11 170 lb (77.111 kg)    BMI:  Body mass index is 14.09 kg/(m^2).  Estimated Nutritional Needs:   Kcal:  1675-2010 kcals  using IBW  67 kg  Protein:  80-94 g (1.2-1.4 g/kg)   Fluid:  1675-2010 mL   HIGH Care Level  Romelle Starcherate Ravin Bendall MS, RD, LDN [phone] Pager  [phone] Weekend/On-Call Pager

## 2015-11-05 NOTE — Progress Notes (Signed)
Orderly notified to transfer pt. To room 260

## 2015-11-05 NOTE — Clinical Social Work Note (Signed)
Clinical Social Work Assessment  Patient Details  Name: Ian [name] MRN: [id number] Date of Birth: Mar 28, 1951  Date of referral:  11/05/15               Reason for consult:  Discharge Planning                Permission sought to share information with:  Family Supports Permission granted to share information::  Yes, Verbal Permission Granted  Name::        Agency::     Relationship::   [name] [phone])  Contact Information:     Housing/Transportation Living arrangements for the past 2 months:  Single Family Home Source of Information:  Patient, Other (Comment Required) [name] [phone]) Patient Interpreter Needed:  None Criminal Activity/Legal Involvement Pertinent to Current Situation/Hospitalization:  No - Comment as needed Significant Relationships:  Adult Children, Other Family Members Lives with:  Other (Comment), Adult Children (Ian [name] ) Do you feel safe going back to the place where you live?  Yes Need for family participation in patient care:  Yes (Comment) [name] [phone])  Care giving concerns:  Patient is from home with his stepdaughter. His daughter that was caring for him was recently incarcerated. Patient's sister has concerns about patient's drug usage and living arrangements. Patient is not able to walk and she is fearful for him to return home with his "stepdaughter"    Social Worker assessment / plan:  CSW was consulted by Therapist, sports. She reports that patient is "living from house to house and has cocaine and marijuana" in his system. CSW met with patient at bedside. CSW explained her role. Per patient he lives in [name]. He reports he lives with [name]. Patient's sister requested CSW spoke to her in the hall. Per patient's sister- Para March she is fearful for patient to return home. She stated that patient isn't cared for at home and all of his children are "on drugs or in prison". CSW explained that  patient can make his own decisions due to being alert and oriented x3.  She reports that she's interested in HPOA. CSW encouraged her to speak to [place] services if patient is interested.   PT informed CSW that patient will possibly need SNF. CSW spoke to patient. He reports that he does not want to go to SNF but would talk about it with his family verbal permission granted to complete FL2 and fax out. Explained that with Medicaid he may have to go outside of [facility] for SNF bed. Awaiting bed offers. Awaiting Palliative Care to see patient.   CSW will continue to follow and assist.   Employment status:  Retired Forensic scientist:  Medicaid In [place] PT Recommendations:  [place] / Referral to community resources:  [place]  Patient/Family's Response to care:  Patient reports that he's not interested in SNF but will discuss it with his family.   Patient/Family's Understanding of and Emotional Response to Diagnosis, Current Treatment, and Prognosis:  Patient reports he understands CSW but was not responsive during assessment. Patient closed his eyes and answered CSW's questions with one word responses.   Emotional Assessment Appearance:  Appears older than stated age Attitude/Demeanor/Rapport:   (None) Affect (typically observed):  Calm, Pleasant Orientation:  Oriented to Self, Oriented to  Time, Oriented to Place Alcohol / Substance use:  Illicit Drugs Psych involvement (Current and /or in the community):  No (Comment)  Discharge Needs  Concerns to be  addressed:  Discharge Planning Concerns Readmission within the last 30 days:  No Current discharge risk:  Chronically ill Barriers to Discharge:  Continued Medical Work up   [employer], LCSW 11/05/2015, 5:00 PM

## 2015-11-05 NOTE — Progress Notes (Signed)
Patient arrived from 1A right after shift change. RN that took report from night shift was not made aware patient was here. Once this RN was informed patient was here, both RN's went into room to do report and get patient set up on the monitor. Telemetry verified, box 40-30. Heparin drip was ordered while patient was still on 1A and it was never started. Pharmacy has been called to send med so drip can be started. Skin re-verified with Salli Quarryrystal Beasley, RN. Patient made comfortable in bed. Will continue to monitor.

## 2015-11-05 NOTE — Progress Notes (Signed)
Speech Therapy Note: received order, reviewed chart notes and consulted NSG re: pt's status today. Pt is tolerating thin liquids w/ no overt s/s of aspiration noted by NSG. Dietician wanting to provide drink supplements sec. to pt's declined nutritional status. Per MD ok, diet was modified to a full liquid sec. to pt's toleration of current diet and slightly improved status overall per NSG. Rec. aspiration precautions precautions and meds w/ puree as necessary. ST will f/u w/ BSE tomorrow. NSG agreed.

## 2015-11-05 NOTE — Progress Notes (Signed)
Pharmacy Antibiotic Note  Ian PellantBobby L Eshleman is a 65 y.o. male admitted on 11/04/2015 with bacteremia.  Pharmacy has been consulted for ceftriaxone dosing.  Plan: BCID results were discussed with Dr. Renae GlossWieting and the patient's antibiotics will be changed to ceftriaxone 2g q 24hr. Further narrowing will be determined based on finalized susceptibilities.  Recommended meropenem incase ESBL, Dr. Renae GlossWieting wished to keep ceftriaxone and wait for sensitivities  Height: 5\' 7"  (170.2 cm) Weight: 90 lb (40.824 kg) IBW/kg (Calculated) : 66.1  Temp (24hrs), Avg:97.3 F (36.3 C), Min:96.9 F (36.1 C), Max:97.9 F (36.6 C)   Recent Labs Lab 11/04/15 1737 11/04/15 2319 11/05/15 0250  WBC 10.9*  --  8.3  CREATININE 0.63  --  0.57*  LATICACIDVEN 1.7 3.6* 2.5*    Estimated Creatinine Clearance: 54.5 mL/min (by C-G formula based on Cr of 0.57).    No Known Allergies  Antimicrobials this admission: 3/8 ceftriaxone >>    Dose adjustments this admission: Ceftriaxone 1 g increased to 2g q 24  Microbiology results: 3/8 BCx: ecoli   Thank you for allowing pharmacy to be a part of this patient's care.  Olene FlossMelissa D Caellum Mancil 11/05/2015 11:09 AM

## 2015-11-05 NOTE — Progress Notes (Signed)
Third troponin 0.44. Dr. Sheryle Hailiamond notified and starting on heparin drip. Transfer to 2a

## 2015-11-05 NOTE — Progress Notes (Signed)
Pt. Being transferred to 260. Report called to MicrosoftCrystal RN.

## 2015-11-05 NOTE — Consult Note (Signed)
Please see full GI consult by Ms Ian [name].  Patient seen and examined, chart reviewed.  Patient admitted with weakness, nausea hypoglycemia.  Patietn with jaundice, abnormal lfts.  Known h/o etoh abuse and positive for cocaine and marijuana on admission.  Patient with previous dx of cirrhosis, with liver irregularity noted on previous CT evaluations.  Currently noted with fatty liver infiltration.  Patient with evidence of marked malnutrition.   Known h/o severe pancreatic malabsorbtion.  Positive h/o hepatitis C. Elevated troponins on admission blood cultures positive for enterobacter, probable pyelonephritis with sepsis.  MELD is 21, CP class c.   1) likely severe protein calorie malnutrition, multifactorial in the setting of etoh abuse, polysubstance abuse and chronic pancreatitis/pancreatic malabsorption. Consider dietary consult re refeeding recs. Pancreatic enzymes have been tried in the past, but may not have been a high enough dose.   2) I have personally reviewed and discussed CT scans from 3013, 06/21/2014 and yesterday with radiology.  There is no evidence of a mass in the esophagus or stomach.  The "thickening" seen on previous films could be the result of esophagitis, esophageal muscle contraction or local edema at the time.  Of note EGD done in on 06/19/2014 showed normal esophagus, stomach and duodenum. Colonoscopy done that same date was normal except for internal hemorrhoids and biopsies of both the duodenum and colon were normal;.  3) patient placed on ppi for prophylaxis for gerd.   4) will check alphafetoprotein, consider echocardiogram  If not done recently 5) agree with palliative care consultation and consideration for possible hospice placement 6) in regard to the finding of fatty liver, both hepatitis c and starvation can be associated with fatty liver changes. There is a prexisting history of cirrhosis remarked upon in previous imaging studies (note CT 2013).    Will follow with  you. Ian DeemMartin U Amiria Orrison, MD Gastroenterology

## 2015-11-05 NOTE — [place] FL2 (Signed)
[place] MEDICAID FL2 LEVEL OF CARE SCREENING TOOL     IDENTIFICATION  Patient Name: Ian [name] Birthdate: 09-05-1950 Sex: male Admission Date (Current Location): 11/04/2015  [facility] [place]  [place]  ([id number]) Facility and Address:  [facility], [address], [place], [place] [address]      Provider Number: [id number]  Attending Physician Name and Address:  Alford Highlandichard Wieting, MD  Relative Name and Phone Number:       Current Level of Care: Hospital Recommended Level of Care: Skilled Nursing Facility Prior Approval Number:    Date Approved/Denied:   PASRR Number:  [id number])  Discharge Plan: SNF    Current Diagnoses: Patient Active Problem List   Diagnosis Date Noted  . Protein-calorie malnutrition, severe 11/05/2015  . Jaundice 11/04/2015    Orientation RESPIRATION BLADDER Height & Weight     Self, Time, Situation  Normal Incontinent Weight: 90 lb (40.824 kg) Height:  5\' 7"  (170.2 cm)  BEHAVIORAL SYMPTOMS/MOOD NEUROLOGICAL BOWEL NUTRITION STATUS   (None)  (None) Continent Diet (full liquid )  AMBULATORY STATUS COMMUNICATION OF NEEDS Skin   Extensive Assist Verbally Normal                       Personal Care Assistance Level of Assistance  Bathing, Feeding, Dressing Bathing Assistance: Limited assistance Feeding assistance: Limited assistance Dressing Assistance: Limited assistance     Functional Limitations Info  Sight, Hearing, Speech Sight Info: Adequate Hearing Info: Impaired Speech Info: Adequate    SPECIAL CARE FACTORS FREQUENCY  PT (By licensed PT)     PT Frequency:  (3)              Contractures      Additional Factors Info  Code Status, Allergies Code Status Info:  (Full Code) Allergies Info:  (No Known Allergies)           Current Medications (11/05/2015):  This is the current hospital active medication list Current Facility-Administered Medications  Medication Dose  Route Frequency Provider Last Rate Last Dose  . acetaminophen (TYLENOL) tablet 650 mg  650 mg Oral Q6H PRN Enid Baasadhika Kalisetti, MD       Or  . acetaminophen (TYLENOL) suppository 650 mg  650 mg Rectal Q6H PRN Enid Baasadhika Kalisetti, MD      . cefTRIAXone (ROCEPHIN) 2 g in dextrose 5 % 50 mL IVPB  2 g Intravenous Q24H Alford Highlandichard Wieting, MD   2 g at 11/05/15 1316  . cloNIDine (CATAPRES - Dosed in mg/24 hr) patch 0.1 mg  0.1 mg Transdermal Weekly Enid Baasadhika Kalisetti, MD   0.1 mg at 11/05/15 0004  . dextrose 5 % solution   Intravenous Continuous Alford Highlandichard Wieting, MD 20 mL/hr at 11/05/15 1532    . feeding supplement (ENSURE ENLIVE) (ENSURE ENLIVE) liquid 237 mL  237 mL Oral TID WC Alford Highlandichard Wieting, MD      . hydrALAZINE (APRESOLINE) injection 10 mg  10 mg Intravenous Q6H PRN Enid Baasadhika Kalisetti, MD      . lactulose (CHRONULAC) 10 GM/15ML solution 20 g  20 g Oral Daily Christiane Lexine BatonHartman London, NP      . morphine 2 MG/ML injection 2 mg  2 mg Intravenous Q4H PRN Enid Baasadhika Kalisetti, MD   2 mg at 11/05/15 1323  . ondansetron (ZOFRAN) tablet 4 mg  4 mg Oral Q6H PRN Enid Baasadhika Kalisetti, MD       Or  . ondansetron (ZOFRAN) injection 4 mg  4 mg Intravenous Q6H PRN Radhika  Nemiah Commander, MD      . potassium chloride SA (K-DUR,KLOR-CON) CR tablet 40 mEq  40 mEq Oral Once Alford Highland, MD      . sodium chloride 0.9 % bolus 1,000 mL  1,000 mL Intravenous PRN Oralia Manis, MD   1,000 mL at 11/05/15 0002  . sodium chloride flush (NS) 0.9 % injection 3 mL  3 mL Intravenous Q12H Enid Baas, MD   3 mL at 11/05/15 1610     Discharge Medications: Please see discharge summary for a list of discharge medications.  Relevant Imaging Results:  Relevant Lab Results:   Additional Information  (SSN [id number])  Verta Ellen Sunkins, LCSW

## 2015-11-06 LAB — COMPREHENSIVE METABOLIC PANEL
ALT: 96 U/L — ABNORMAL HIGH (ref 17–63)
ANION GAP: 6 (ref 5–15)
AST: 78 U/L — ABNORMAL HIGH (ref 15–41)
Albumin: 1.7 g/dL — ABNORMAL LOW (ref 3.5–5.0)
Alkaline Phosphatase: 445 U/L — ABNORMAL HIGH (ref 38–126)
BUN: 31 mg/dL — ABNORMAL HIGH (ref 6–20)
CHLORIDE: 104 mmol/L (ref 101–111)
CO2: 24 mmol/L (ref 22–32)
Calcium: 7.6 mg/dL — ABNORMAL LOW (ref 8.9–10.3)
Creatinine, Ser: 0.6 mg/dL — ABNORMAL LOW (ref 0.61–1.24)
GFR calc non Af Amer: >60 mL/min (ref >60)
Glucose, Bld: 136 mg/dL — ABNORMAL HIGH (ref 65–99)
Potassium: 3.2 mmol/L — ABNORMAL LOW (ref 3.5–5.1)
SODIUM: 134 mmol/L — AB (ref 135–145)
Total Bilirubin: 6.2 mg/dL — ABNORMAL HIGH (ref 0.3–1.2)
Total Protein: 5.3 g/dL — ABNORMAL LOW (ref 6.5–8.1)

## 2015-11-06 LAB — CBC
HCT: 25.2 % — ABNORMAL LOW (ref 40.0–52.0)
Hemoglobin: 8.3 g/dL — ABNORMAL LOW (ref 13.0–18.0)
MCH: 28.6 pg (ref 26.0–34.0)
MCHC: 33 g/dL (ref 32.0–36.0)
MCV: 86.6 fL (ref 80.0–100.0)
PLATELETS: 11 10*3/uL — AB (ref 150–400)
RBC: 2.91 MIL/uL — AB (ref 4.40–5.90)
RDW: 15.4 % — ABNORMAL HIGH (ref 11.5–14.5)
WBC: 14.3 10*3/uL — ABNORMAL HIGH (ref 3.8–10.6)

## 2015-11-06 LAB — PROTIME-INR
INR: 2.27
PROTHROMBIN TIME: 24.8 s — AB (ref 11.4–15.0)

## 2015-11-06 LAB — GLUCOSE, CAPILLARY
GLUCOSE-CAPILLARY: 147 mg/dL — AB (ref 65–99)
GLUCOSE-CAPILLARY: 80 mg/dL (ref 65–99)
GLUCOSE-CAPILLARY: 89 mg/dL (ref 65–99)
GLUCOSE-CAPILLARY: 109 mg/dL — AB (ref 65–99)
GLUCOSE-CAPILLARY: 116 mg/dL — AB (ref 65–99)
GLUCOSE-CAPILLARY: 142 mg/dL — AB (ref 65–99)
GLUCOSE-CAPILLARY: 114 mg/dL — AB (ref 65–99)
Glucose-Capillary: 104 mg/dL — ABNORMAL HIGH (ref 65–99)
Glucose-Capillary: 123 mg/dL — ABNORMAL HIGH (ref 65–99)
Glucose-Capillary: 84 mg/dL (ref 65–99)
Glucose-Capillary: 91 mg/dL (ref 65–99)
Glucose-Capillary: 95 mg/dL (ref 65–99)

## 2015-11-06 LAB — AMMONIA: Ammonia: 31 umol/L (ref 9–35)

## 2015-11-06 LAB — FIBRINOGEN: Fibrinogen: 433 mg/dL (ref 210–470)

## 2015-11-06 LAB — ABO/RH: ABO/RH(D): B POS

## 2015-11-06 MED ORDER — POTASSIUM CHLORIDE 10 MEQ/100ML IV SOLN
10.0000 meq | INTRAVENOUS | Status: AC
  Administered 2015-11-06 (×3): 10 meq via INTRAVENOUS
  Filled 2015-11-06 (×3): qty 100

## 2015-11-06 MED ORDER — CETYLPYRIDINIUM CHLORIDE 0.05 % MT LIQD
7.0000 mL | Freq: Two times a day (BID) | OROMUCOSAL | Status: DC
  Administered 2015-11-06 – 2015-11-14 (×15): 7 mL via OROMUCOSAL

## 2015-11-06 MED ORDER — SODIUM CHLORIDE 0.9 % IV SOLN
Freq: Once | INTRAVENOUS | Status: AC
  Administered 2015-11-06: 15:00:00 via INTRAVENOUS

## 2015-11-06 MED ORDER — PANCRELIPASE (LIP-PROT-AMYL) 12000-38000 UNITS PO CPEP
24000.0000 [IU] | ORAL_CAPSULE | Freq: Three times a day (TID) | ORAL | Status: DC
  Administered 2015-11-07 – 2015-11-10 (×10): 24000 [IU] via ORAL
  Filled 2015-11-06 (×10): qty 2

## 2015-11-06 MED ORDER — PHYTONADIONE 5 MG PO TABS
5.0000 mg | ORAL_TABLET | Freq: Every day | ORAL | Status: DC
  Administered 2015-11-07 – 2015-11-10 (×4): 5 mg via ORAL
  Filled 2015-11-06 (×5): qty 1

## 2015-11-06 NOTE — Care Management (Signed)
Receiving platelet infusion.  Bacteremia with E coli.  Attending has spoken with the patient's daughter Ian [name].  She states that patient should be full code- everything possible done for patient and no other family members should be involved in his  care decisions.

## 2015-11-06 NOTE — Progress Notes (Signed)
Alert but withdrawn. Patient wanting to be left alone, grumbling and yelling when having to be changed or turned. Otherwise no complaints. Medicated for pain once during the night. Incontinent and bedbound. Blood sugars stable, being checked every 2 hours. IV dextrose infusing.

## 2015-11-06 NOTE — Progress Notes (Deleted)
Notified Dr. Sheryle Hailiamond of H&H of

## 2015-11-06 NOTE — Progress Notes (Signed)
CSW was informed by MD Renae GlossWieting that patient's daughter requested that no other family members were spoken to about patient's care. Patient declined SNF placement yesterday. RN Case Manager aware of above. CSW will continue to follow and assist.   Woodroe Modehristina Rehman Levinson, MSW, LCSW-A Clinical Social Work Department [phone]

## 2015-11-06 NOTE — Evaluation (Signed)
Clinical/Bedside Swallow Evaluation Patient Details  Name: Christiana PellantBobby L Stegenga MRN: [id number] Date of Birth: November 22, 1950  Today's Date: 11/06/2015 Time: SLP Start Time (ACUTE ONLY): 1345 SLP Stop Time (ACUTE ONLY): 1445 SLP Time Calculation (min) (ACUTE ONLY): 60 min  Past Medical History:  Past Medical History  Diagnosis Date  . Diabetes mellitus   . HTN (hypertension)   . Cirrhosis of liver (HCC)   . Hepatitis C   . Neuropathy (HCC)   . Cirrhosis [facility](HCC)    Past Surgical History:  Past Surgical History  Procedure Laterality Date  . Mandible surgery    . Dental surgery      teeth extraction   HPI:  Pt is a 65 y.o. male with a known history of diabetes mellitus, hypertension, Hep C, alcoholic liver cirrhosis, chronic peripheral neuropathy sense from home secondary to poor by mouth intake and failure to thrive. He appears very disheveled and hasn't been well taken care of by his appearance. Daughter was explained how sick the patient was but she says that she cannot care for him at home and he will need placement. He appears jaundiced and smells of urine. He hasn't been eating for the past few weeks at home. Occasional drinking beer continuous. Labs indicate jaundice, also thrombocytopenia and dehydration. Currently, pt is uncomfortable d/t pain from IV meds. NSG addressing this. Pt agreed to a few po's. He has been tolerating a full liquid diet since recommendation yesterday per NSG report. Pt eat bites/sips.    Assessment / Plan / Recommendation Clinical Impression  Pt appeared to tolerate trials of thin liquids via straw and purees (w/ support feeding/drinking) w/ no immediate, overt s/s of aspiration noted. Clear vocal quality noted b/t trials; no decline in respiratory status noted post trials. Oral phase appeared wfl w/ these trials assessed; timely transfer and adequate oral clearing post trials. Due to pt's overall declined status and challenged positioning upright in bed sec. to  painful limbs, and overall declined status/weakness, rec. a Dys. 1 (puree) w/ thin liquids diet; aspiration precautions; meds in puree w/ NSG; assistance feeding. Reflux precautions. Dietician following.     Aspiration Risk   (reduced w/ aspiration precautions)    Diet Recommendation  Dys. 1 w/ thin liquids; aspiration precautions; reflux precautions; assistance feeding at meals.   Medication Administration: Whole meds with puree    Other  Recommendations Recommended Consults:  (dietician following) Oral Care Recommendations: Oral care BID;Staff/trained caregiver to provide oral care   Follow up Recommendations   (TBD)    Frequency and Duration min 2x/week  2 weeks       Prognosis Prognosis for Safe Diet Advancement: Fair Barriers to Reach Goals: Severity of deficits      Swallow Study   General Date of Onset: 11/04/15 HPI: Pt is a 65 y.o. male with a known history of diabetes mellitus, hypertension, Hep C, alcoholic liver cirrhosis, chronic peripheral neuropathy sense from home secondary to poor by mouth intake and failure to thrive. He appears very disheveled and hasn't been well taken care of by his appearance. Daughter was explained how sick the patient was but she says that she cannot care for him at home and he will need placement. He appears jaundiced and smells of urine. He hasn't been eating for the past few weeks at home. Occasional drinking beer continuous. Labs indicate jaundice, also thrombocytopenia and dehydration. Currently, pt is uncomfortable d/t pain from IV meds. NSG addressing this. Pt agreed to a few po's. He has been  tolerating a full liquid diet since recommendation yesterday per NSG report. Pt eat bites/sips.  Type of Study: Bedside Swallow Evaluation Previous Swallow Assessment: none reported Diet Prior to this Study:  (unsure; pt reportedly not eating) Temperature Spikes Noted: No (wbc 14.3) Respiratory Status: Room air History of Recent Intubation:  No Behavior/Cognition: Alert;Cooperative;Distractible;Requires cueing (uncomfortable) Oral Cavity Assessment: Within Functional Limits Oral Care Completed by SLP: Recent completion by staff Oral Cavity - Dentition: Edentulous Vision:  (n/a) Self-Feeding Abilities: Total assist Patient Positioning: Postural control adequate for testing Baseline Vocal Quality: Low vocal intensity (mumbled speech) Volitional Cough: Cognitively unable to elicit Volitional Swallow: Able to elicit    Oral/Motor/Sensory Function Overall Oral Motor/Sensory Function: Within functional limits (grossly assessed sec. to pt's status)   Ice Chips Ice chips: Not tested   Thin Liquid Thin Liquid: Within functional limits Presentation: Self Fed;Straw (assisted; ~6-7 ozs total b/t drinks)    Nectar Thick Nectar Thick Liquid: Not tested   Honey Thick Honey Thick Liquid: Not tested   Puree Puree: Within functional limits Presentation: Spoon (fed; 9-10 trials) Other Comments: then declined further   Solid   GO   Solid: Not tested Other Comments: not rec'd at this time sec. to overall status; edentulous        Jerilynn Som, MS, CCC-SLP  Marquis Down 11/06/2015,3:24 PM

## 2015-11-06 NOTE — Progress Notes (Signed)
Patient has been oriented at times and then he gets confused and states he doesn't know what's going on after waking up from sleeping. Patient has made a couple of comments today insinuating he does not want to be here. I asked him if I could get him anything and he stated "a bullet". Then later I was in the room giving IV medications and he kept asking me to leave him alone and to "just let me die". When patient becomes more oriented, he is very cooperative and is fine with me performing care. Plan now is still to keep patient a full code per his daughter's conversation with Dr. Renae GlossWieting.

## 2015-11-06 NOTE — Progress Notes (Signed)
Patient ID: Ian [name], male   DOB: 06-Mar-1951, 65 y.o.   MRN: [id number] [facility] PROGRESS NOTE  Ian [name]:[id number] DOB: 1951/05/22 DOA: 11/04/2015 PCP: [name], PA  HPI/Subjective: Patient in pain with the way he is sitting. Was very upset with me about his positioning. He told me to get away from him.  Objective: Filed Vitals:   11/05/15 1956 11/06/15 0412  BP: 126/86 114/82  Pulse: 94 101  Temp: 97.7 F (36.5 C) 97.8 F (36.6 C)  Resp: 20 20    Intake/Output Summary (Last 24 hours) at 11/06/15 0920 Last data filed at 11/05/15 1845  Gross per 24 hour  Intake 701.83 ml  Output    226 ml  Net 475.83 ml   Filed Weights   11/04/15 1734  Weight: 40.824 kg (90 lb)    ROS: Review of Systems  Unable to perform ROS Patient upset about his positioning in the bed and did not want to speak with me Exam: Physical Exam  Constitutional: He appears cachectic.  HENT:  Nose: No mucosal edema.  Mouth/Throat: No oropharyngeal exudate or posterior oropharyngeal edema.  Dry, thrush  Eyes: Lids are normal. Pupils are equal, round, and reactive to light.  Jaundice  Neck: No JVD present. Carotid bruit is not present. No edema present. No thyroid mass and no thyromegaly present.  Cardiovascular: S1 normal and S2 normal.  Tachycardia present.  Exam reveals no gallop.   No murmur heard. Pulses:      Dorsalis pedis pulses are 2+ on the right side, and 2+ on the left side.  Respiratory: No respiratory distress. He has no wheezes. He has no rhonchi. He has no rales.  GI: Soft. Bowel sounds are normal. There is no tenderness.  Musculoskeletal:       Right ankle: He exhibits swelling.       Left ankle: He exhibits swelling.  Lymphadenopathy:    He has no cervical adenopathy.  Neurological: He is alert.  Able to move extremities  Skin: Skin is warm. Nails show no clubbing.  Psychiatric: His affect is angry.      Data Reviewed: Basic Metabolic  Panel:  Recent Labs Lab 11/04/15 1737 11/05/15 0250 11/06/15 0419  NA 137 139 134*  K 3.4* 3.4* 3.2*  CL 100* 107 104  CO2 GLUCOSE 55* 79 136*  BUN 42* 38* 31*  CREATININE 0.63 0.57* 0.60*  CALCIUM 8.4* 8.0* 7.6*   Liver Function Tests:  Recent Labs Lab 11/04/15 1737 11/05/15 2112 11/06/15 0419  AST 333* 97* 78*  ALT 185* 108* 96*  ALKPHOS 706* 448* 445*  BILITOT 6.7* 5.5* 6.2*  PROT 6.3* 5.4* 5.3*  ALBUMIN 2.0* 1.8* 1.7*    Recent Labs Lab 11/04/15 1737  LIPASE <10*    Recent Labs Lab 11/04/15 1737 11/06/15 0419  AMMONIA <9* 31   CBC:  Recent Labs Lab 11/04/15 1737 11/05/15 0250 11/06/15 0419  WBC 10.9* 8.3 14.3*  NEUTROABS 9.2*  --   --   HGB 9.9* 9.1* 8.3*  HCT 29.8* 27.3* 25.2*  MCV 85.1 86.3 86.6  PLT 22* 17* 11*   Cardiac Enzymes:  Recent Labs Lab 11/04/15 1737 11/04/15 2319 11/05/15 0250 11/05/15 0630 11/05/15 2112  CKTOTAL  --   --   --   --  92  TROPONINI 0.17* 0.16* 0.44* 0.73*  --     CBG:  Recent Labs Lab 11/05/15 2351 11/06/15 0201 11/06/15 0401 11/06/15 0559 11/06/15 0732  GLUCAP 169* 147* 123* 95 80      Studies: Ct Abdomen Pelvis W Contrast  11/04/2015  CLINICAL DATA:  Chronic abdominal pain. History of cirrhosis. Initial encounter. EXAM: CT ABDOMEN AND PELVIS WITH CONTRAST TECHNIQUE: Multidetector CT imaging of the abdomen and pelvis was performed using the standard protocol following bolus administration of intravenous contrast. CONTRAST:  75 ml OMNIPAQUE IOHEXOL 300 MG/ML  SOLN COMPARISON:  CT chest, abdomen and pelvis 06/23/2014. FINDINGS: There is no pleural or pericardial effusion. Heart size is normal. There is some mild atelectasis or scar in the left lung base. The patient is cachectic. There is diffuse body wall and mesenteric edema and some abdominal and pelvic ascites. No drainable collection is seen. A 0.6 cm nonobstructing stone is seen in the midpole of the right kidney. Contrast excretion  from the right kidney is delayed and there are areas of cortical hypoattenuation on delayed imaging. No obstructing stone is seen. The urinary bladder is unremarkable. The spleen and adrenal glands appear normal. Chronic pancreatic ductal dilatation with extensive calcifications throughout the pancreas consistent with chronic pancreatitis appear unchanged. There is a small low attenuating lesion in the left hepatic lobe on image 12 is unchanged. The liver is otherwise unremarkable. The gallbladder appears normal. Small bowel loops are largely decompressed but otherwise unremarkable. The colon and stomach appear normal. The appendix is not visualized but no focal inflammatory process is seen. Aortoiliac atherosclerosis without aneurysm is identified. No focal bony abnormality is seen. IMPRESSION: Cachectic appearing patient with diffuse body wall and mesenteric edema consistent with anasarca. Abnormal enhancement pattern right kidney is most worrisome for pyelonephritis. Nonobstructing stone midpole right kidney. Chronic calcific pancreatitis. Atherosclerosis. Electronically Signed   By: Drusilla Kanner M.D.   On: 11/04/2015 20:35    Scheduled Meds: . sodium chloride   Intravenous Once  . cefTRIAXone (ROCEPHIN)  IV  2 g Intravenous Q24H  . cloNIDine  0.1 mg Transdermal Weekly  . feeding supplement (ENSURE ENLIVE)  237 mL Oral TID WC  . pantoprazole (PROTONIX) IV  40 mg Intravenous Q24H  . phytonadione  5 mg Oral Daily  . potassium chloride  10 mEq Intravenous Q1 Hr x 3  . sodium chloride flush  3 mL Intravenous Q12H   Continuous Infusions: . dextrose 20 mL/hr at 11/05/15 1532    Assessment/Plan:  1.  Bacteremia with Escherichia coli. Could be urine source. Continue Rocephin until cultures are back. Confirm full CODE STATUS with daughter.  2. Severe hypoglycemia with liver failure, acute encephalopathy. Continue D5 since sugars are starting to come down in the 80s this morning. 3.  Alcoholic  cirrhosis, increased liver function test, jaundice, thrombocytopenia, coagulopathy, cachexia. Overall prognosis is poor. Patient is a full code. Consent to transfuse platelets obtained with daughter over the phone. 5. History of hypertension on clonidine patch 6. Acute cystitis with hematuria on Rocephin and follow-up cultures 7. Elevated troponin likely demand ischemia. Would not give heparin drip for blood thinners at this time secondary to platelets being 17 and patient auto anticoagulated. 8. History of hepatitis C 9. History of neuropathy 10. Overall prognosis is very poor.  I spoke with the daughter on the phone and she did not wanted me to speak to any other family members besides her mother.  Code Status:     Code Status Orders        Start     Ordered   11/04/15 2157  Full code   Continuous     11/04/15 2156  Code Status History    Date Active Date Inactive Code Status Order ID Comments User Context   This patient has a current code status but no historical code status.     Family Communication: Spoke with the daughter and she did not want me to speak to any other family members besides her mother. Patient's full CODE STATUS confirmed. Verbal consent for platelet transfusion. Disposition Plan: To be determined  Consultants:  Palliative care  Antibiotics:  Rocephin  Time spent: 30 minutes.   Alford HighlandWIETING, Mikaeel Petrow  [facility]

## 2015-11-06 NOTE — Consult Note (Addendum)
Subjective: Patient seen for abnormal liver testing, history of cirrhosis, malnutrition. Patient would not respond to questions from this examiner. He did allow me to listen to his heart and lungs and abdomen exam area this was benign. He was interested in the food he was eating with his daughter who was feeding him.  Objective: Vital signs in last 24 hours: Temp:  [97.3 F (36.3 C)-98 F (36.7 C)] 97.3 F (36.3 C) (03/10 1630) Pulse Rate:  [84-101] 90 (03/10 1630) Resp:  [16-20] 16 (03/10 1630) BP: (102-149)/(66-100) 149/99 mmHg (03/10 1630) SpO2:  [100 %] 100 % (03/10 1630) Blood pressure 149/99, pulse 90, temperature 97.3 F (36.3 C), temperature source Oral, resp. rate 16, height  (1.702 m), weight 40.824 kg (90 lb), SpO2 100 %.   Intake/Output from previous day: 03/09 0701 - 03/10 0700 In: 1265.2 [P.O.:480; I.V.:735.2; IV Piggyback:50] Out: 426 [Urine:426]  Intake/Output this shift: Total I/O In: 200 [Blood:200] Out: -    General appearance:  Cachectic 77 male no distress Resp:  Clear to auscultation Cardio:  Regular rate and rhythm GI:  Soft nontender nondistended, bowel sounds are positive. Normoactive. Extremities: Clubbing cyanosis or edema   Lab Results: Results for orders placed or performed during the hospital encounter of 11/04/15 (from the past 24 hour(s))  Glucose, capillary     Status: Abnormal   Collection Time: 11/05/15  7:57 PM  Result Value Ref Range   Glucose-Capillary 291 (H) 65 - 99 mg/dL   Comment 1 Notify RN   Protime-INR     Status: Abnormal   Collection Time: 11/05/15  9:12 PM  Result Value Ref Range   Prothrombin Time 27.4 (H) 11.4 - 15.0 seconds   INR 2.59   Hepatic function panel     Status: Abnormal   Collection Time: 11/05/15  9:12 PM  Result Value Ref Range   Total Protein 5.4 (L) 6.5 - 8.1 g/dL   Albumin 1.8 (L) 3.5 - 5.0 g/dL   AST 97 (H) 15 - 41 U/L   ALT 108 (H) 17 - 63 U/L   Alkaline Phosphatase 448 (H) 38 - 126 U/L   Total Bilirubin 5.5 (H) 0.3 - 1.2 mg/dL   Bilirubin, Direct 3.7 (H) 0.1 - 0.5 mg/dL   Indirect Bilirubin 1.8 (H) 0.3 - 0.9 mg/dL  CK     Status: None   Collection Time: 11/05/15  9:12 PM  Result Value Ref Range   Total CK 92 49 - 397 U/L  Glucose, capillary     Status: Abnormal   Collection Time: 11/05/15  9:54 PM  Result Value Ref Range   Glucose-Capillary 234 (H) 65 - 99 mg/dL   Comment 1 Notify RN   Glucose, capillary     Status: Abnormal   Collection Time: 11/05/15 11:51 PM  Result Value Ref Range   Glucose-Capillary 169 (H) 65 - 99 mg/dL   Comment 1 Notify RN   Glucose, capillary     Status: Abnormal   Collection Time: 11/06/15  2:01 AM  Result Value Ref Range   Glucose-Capillary 147 (H) 65 - 99 mg/dL   Comment 1 Notify RN   Glucose, capillary     Status: Abnormal   Collection Time: 11/06/15  4:01 AM  Result Value Ref Range   Glucose-Capillary 123 (H) 65 - 99 mg/dL  CBC     Status: Abnormal   Collection Time: 11/06/15  4:19 AM  Result Value Ref Range   WBC 14.3 (H) 3.8 - 10.6 K/uL  RBC 2.91 (L) 4.40 - 5.90 MIL/uL   Hemoglobin 8.3 (L) 13.0 - 18.0 g/dL   HCT 16.1 (L) 09.6 - 04.5 %   MCV 86.6 80.0 - 100.0 fL   MCH 28.6 26.0 - 34.0 pg   MCHC 33.0 32.0 - 36.0 g/dL   RDW 40.9 (H) 81.1 - 91.4 %   Platelets 11 (LL) 150 - 400 K/uL  Comprehensive metabolic panel     Status: Abnormal   Collection Time: 11/06/15  4:19 AM  Result Value Ref Range   Sodium 134 (L) 135 - 145 mmol/L   Potassium 3.2 (L) 3.5 - 5.1 mmol/L   Chloride 104 101 - 111 mmol/L   CO2 24 22 - 32 mmol/L   Glucose, Bld 136 (H) 65 - 99 mg/dL   BUN 31 (H) 6 - 20 mg/dL   Creatinine, Ser 7.82 (L) 0.61 - 1.24 mg/dL   Calcium 7.6 (L) 8.9 - 10.3 mg/dL   Total Protein 5.3 (L) 6.5 - 8.1 g/dL   Albumin 1.7 (L) 3.5 - 5.0 g/dL   AST 78 (H) 15 - 41 U/L   ALT 96 (H) 17 - 63 U/L   Alkaline Phosphatase 445 (H) 38 - 126 U/L   Total Bilirubin 6.2 (H) 0.3 - 1.2 mg/dL   GFR calc non Af Amer >60 >60 mL/min   GFR calc Af  Amer >60 >60 mL/min   Anion gap 6 5 - 15  Fibrinogen     Status: None   Collection Time: 11/06/15  4:19 AM  Result Value Ref Range   Fibrinogen 433 210 - 470 mg/dL  Protime-INR     Status: Abnormal   Collection Time: 11/06/15  4:19 AM  Result Value Ref Range   Prothrombin Time 24.8 (H) 11.4 - 15.0 seconds   INR 2.27   Ammonia     Status: None   Collection Time: 11/06/15  4:19 AM  Result Value Ref Range   Ammonia 31 9 - 35 umol/L  Prepare Pheresed Platelets     Status: None (Preliminary result)   Collection Time: 11/06/15  4:19 AM  Result Value Ref Range   Unit Number [id number]    Blood Component Type PLTP LR1 PAS    Unit division 00    Status of Unit ISSUED    Transfusion Status OK TO TRANSFUSE   ABO/Rh     Status: None   Collection Time: 11/06/15  4:19 AM  Result Value Ref Range   ABO/RH(D) B POS   Glucose, capillary     Status: None   Collection Time: 11/06/15  5:59 AM  Result Value Ref Range   Glucose-Capillary 95 65 - 99 mg/dL   Comment 1 Notify RN   Glucose, capillary     Status: None   Collection Time: 11/06/15  7:32 AM  Result Value Ref Range   Glucose-Capillary 80 65 - 99 mg/dL   Comment 1 Notify RN   Glucose, capillary     Status: None   Collection Time: 11/06/15  9:28 AM  Result Value Ref Range   Glucose-Capillary 89 65 - 99 mg/dL   Comment 1 Notify RN   Glucose, capillary     Status: Abnormal   Collection Time: 11/06/15 11:47 AM  Result Value Ref Range   Glucose-Capillary 109 (H) 65 - 99 mg/dL  Glucose, capillary     Status: Abnormal   Collection Time: 11/06/15  2:13 PM  Result Value Ref Range   Glucose-Capillary 104 (H) 65 - 99  mg/dL   Comment 1 Notify RN   Glucose, capillary     Status: Abnormal   Collection Time: 11/06/15  4:06 PM  Result Value Ref Range   Glucose-Capillary 116 (H) 65 - 99 mg/dL   Comment 1 Notify RN       Recent Labs  11/04/15 1737 11/05/15 0250 11/06/15 0419  WBC 10.9* 8.3 14.3*  HGB 9.9* 9.1* 8.3*  HCT 29.8* 27.3*  25.2*  PLT 22* 17* 11*   BMET  Recent Labs  11/04/15 1737 11/05/15 0250 11/06/15 0419  NA 137 139 134*  K 3.4* 3.4* 3.2*  CL 100* 107 104  CO2 29 25 24   GLUCOSE 55* 79 136*  BUN 42* 38* 31*  CREATININE 0.63 0.57* 0.60*  CALCIUM 8.4* 8.0* 7.6*   LFT  Recent Labs  11/05/15 2112 11/06/15 0419  PROT 5.4* 5.3*  ALBUMIN 1.8* 1.7*  AST 97* 78*  ALT 108* 96*  ALKPHOS 448* 445*  BILITOT 5.5* 6.2*  BILIDIR 3.7*  --   IBILI 1.8*  --    PT/INR  Recent Labs  11/05/15 2112 11/06/15 0419  LABPROT 27.4* 24.8*  INR 2.59 2.27   Hepatitis Panel No results for input(s): HEPBSAG, HCVAB, HEPAIGM, HEPBIGM in the last 72 hours. C-Diff No results for input(s): CDIFFTOX in the last 72 hours. No results for input(s): CDIFFPCR in the last 72 hours.   Studies/Results: Ct Abdomen Pelvis W Contrast  11/04/2015  CLINICAL DATA:  Chronic abdominal pain. History of cirrhosis. Initial encounter. EXAM: CT ABDOMEN AND PELVIS WITH CONTRAST TECHNIQUE: Multidetector CT imaging of the abdomen and pelvis was performed using the standard protocol following bolus administration of intravenous contrast. CONTRAST:  75 ml OMNIPAQUE IOHEXOL 300 MG/ML  SOLN COMPARISON:  CT chest, abdomen and pelvis 06/23/2014. FINDINGS: There is no pleural or pericardial effusion. Heart size is normal. There is some mild atelectasis or scar in the left lung base. The patient is cachectic. There is diffuse body wall and mesenteric edema and some abdominal and pelvic ascites. No drainable collection is seen. A 0.6 cm nonobstructing stone is seen in the midpole of the right kidney. Contrast excretion from the right kidney is delayed and there are areas of cortical hypoattenuation on delayed imaging. No obstructing stone is seen. The urinary bladder is unremarkable. The spleen and adrenal glands appear normal. Chronic pancreatic ductal dilatation with extensive calcifications throughout the pancreas consistent with chronic pancreatitis  appear unchanged. There is a small low attenuating lesion in the left hepatic lobe on image 12 is unchanged. The liver is otherwise unremarkable. The gallbladder appears normal. Small bowel loops are largely decompressed but otherwise unremarkable. The colon and stomach appear normal. The appendix is not visualized but no focal inflammatory process is seen. Aortoiliac atherosclerosis without aneurysm is identified. No focal bony abnormality is seen. IMPRESSION: Cachectic appearing patient with diffuse body wall and mesenteric edema consistent with anasarca. Abnormal enhancement pattern right kidney is most worrisome for pyelonephritis. Nonobstructing stone midpole right kidney. Chronic calcific pancreatitis. Atherosclerosis. Electronically Signed   By: Drusilla Kanner M.D.   On: 11/04/2015 20:35    Scheduled Inpatient Medications:   . antiseptic oral rinse  7 mL Mouth Rinse BID  . cefTRIAXone (ROCEPHIN)  IV  2 g Intravenous Q24H  . cloNIDine  0.1 mg Transdermal Weekly  . feeding supplement (ENSURE ENLIVE)  237 mL Oral TID WC  . pantoprazole (PROTONIX) IV  40 mg Intravenous Q24H  . phytonadione  5 mg Oral Daily  .  sodium chloride flush  3 mL Intravenous Q12H    Continuous Inpatient Infusions:   . dextrose 20 mL/hr at 11/05/15 1532    PRN Inpatient Medications:  acetaminophen **OR** acetaminophen, hydrALAZINE, morphine injection, ondansetron **OR** ondansetron (ZOFRAN) IV, sodium chloride  Miscellaneous:   Assessment:  1. Malnutrition, multifactorial with history of pancreatic malabsorption as well as ongoing substance abuse. 2. 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 Patient with anemia and marked thrombocytopenia. Imaging consistent with steatohepatosis. Hypoalbuminemia hyperbilirubinemia. Pro-time this morning improved from yesterday though still elevated. Subacute liver failure. 3. History of chronic calcific pancreatitis and pancreatic  malabsorption  Plan:   1. It is difficult to determine the extent to which his severe malnutrition is affecting the liver function. Prognosis is poor in regards to overall function and current clinical situation. Improvement of nutrition may be of some benefit in the short-term. Recommend the use of Creon 24,000 lipase units with every meal and continued PPI with change to oral from IV. Recommend daily electrolyte check to help avoid refeeding syndrome. Alpha-fetoprotein still pending  Dr. Shelle Ironein is available over the weekend if needed  Christena DeemMartin U Lanis Storlie MD 11/06/2015, 5:46 PM

## 2015-11-06 NOTE — Progress Notes (Signed)
Called blood bank to check on platelets and they informed me they are still waiting to receive them. The company bringing the platelets are running behind. Blood bank is to call me directly when they arrive.

## 2015-11-06 NOTE — Progress Notes (Addendum)
Notified Dr. Sheryle Hailiamond of AM labs. H&H of 8.3 & 25.2. Platelets 11. K of 3.2. No new orders

## 2015-11-06 NOTE — Progress Notes (Signed)
Platelets have been started. Confirmed with Dr. Renae GlossWieting on order for transfusion time and MD stated to run the platelets like we normally would. Will run in an hour rather than 4 hours. Patient is doing well and resting comfortably now.

## 2015-11-06 NOTE — Progress Notes (Signed)
PT Cancellation Note  Patient Details Name: Ian [name] MRN: [id number] DOB: 1951/01/02   Cancelled Treatment:    Reason Eval/Treat Not Completed: Medical issues which prohibited therapy (Platelet count down to 11; pending transfusion at this time.  Will hold therapy efforts until transfusion complete and patient medically appropriate for activity.)   [name], PT, DPT, NCS 11/06/2015, 12:46 PM [phone]

## 2015-11-06 NOTE — Progress Notes (Signed)
Palliative Care Update   I am aware of pts clarified FULL CODE status as confirmed when Dr Hilton SinclairWeiting spoke with pts daughter, Andi DevonMyicia,  today.    At this point, disposition at time of discharge is unclear, but pts daughter has stated that she wants everything done for pt (and only she should be involved in making decisions per notes referencing conversations with her).  At this time, I will sign off. BUT I can be reconsulted if things change and pt is considered to be appropriate for a palliative or comfort approach or even Hospice Home.   Pt certainly has a high risk of not being alive 6 months from now and this is spelled out in my previous note.    Call again if I am needed.   Suan HalterMargaret F Falynn Ailey, MD

## 2015-11-07 LAB — AFP TUMOR MARKER: AFP TUMOR MARKER: 2 ng/mL (ref 0.0–8.3)

## 2015-11-07 LAB — COMPREHENSIVE METABOLIC PANEL
ALBUMIN: 1.7 g/dL — AB (ref 3.5–5.0)
ALT: 105 U/L — AB (ref 17–63)
AST: 172 U/L — ABNORMAL HIGH (ref 15–41)
Alkaline Phosphatase: 833 U/L — ABNORMAL HIGH (ref 38–126)
Anion gap: 6 (ref 5–15)
BUN: 34 mg/dL — ABNORMAL HIGH (ref 6–20)
CHLORIDE: 101 mmol/L (ref 101–111)
CO2: 25 mmol/L (ref 22–32)
CREATININE: 0.5 mg/dL — AB (ref 0.61–1.24)
Calcium: 8 mg/dL — ABNORMAL LOW (ref 8.9–10.3)
GFR calc non Af Amer: >60 mL/min (ref >60)
Glucose, Bld: 92 mg/dL (ref 65–99)
Potassium: 4.4 mmol/L (ref 3.5–5.1)
SODIUM: 132 mmol/L — AB (ref 135–145)
Total Bilirubin: 7.7 mg/dL — ABNORMAL HIGH (ref 0.3–1.2)
Total Protein: 5.2 g/dL — ABNORMAL LOW (ref 6.5–8.1)

## 2015-11-07 LAB — CULTURE, BLOOD (ROUTINE X 2)

## 2015-11-07 LAB — GLUCOSE, CAPILLARY
GLUCOSE-CAPILLARY: 109 mg/dL — AB (ref 65–99)
GLUCOSE-CAPILLARY: 126 mg/dL — AB (ref 65–99)
GLUCOSE-CAPILLARY: 89 mg/dL (ref 65–99)
GLUCOSE-CAPILLARY: 92 mg/dL (ref 65–99)
GLUCOSE-CAPILLARY: 95 mg/dL (ref 65–99)
Glucose-Capillary: 82 mg/dL (ref 65–99)

## 2015-11-07 LAB — CBC
HCT: 23.7 % — ABNORMAL LOW (ref 40.0–52.0)
HEMOGLOBIN: 8 g/dL — AB (ref 13.0–18.0)
MCH: 29.2 pg (ref 26.0–34.0)
MCHC: 34 g/dL (ref 32.0–36.0)
MCV: 85.9 fL (ref 80.0–100.0)
PLATELETS: 23 10*3/uL — AB (ref 150–440)
RBC: 2.76 MIL/uL — AB (ref 4.40–5.90)
RDW: 15.2 % — ABNORMAL HIGH (ref 11.5–14.5)
WBC: 15.5 10*3/uL — ABNORMAL HIGH (ref 3.8–10.6)

## 2015-11-07 LAB — URINE CULTURE

## 2015-11-07 MED ORDER — PANTOPRAZOLE SODIUM 40 MG PO TBEC: 40.0000 mg | DELAYED_RELEASE_TABLET | Freq: Every day | ORAL | Status: DC

## 2015-11-07 MED ORDER — LORAZEPAM 1 MG PO TABS
1.0000 mg | ORAL_TABLET | Freq: Two times a day (BID) | ORAL | Status: DC | PRN
  Administered 2015-11-07 – 2015-11-09 (×3): 1 mg via ORAL
  Filled 2015-11-07 (×3): qty 1

## 2015-11-07 MED ORDER — PANTOPRAZOLE SODIUM 40 MG PO PACK
40.0000 mg | PACK | Freq: Every day | ORAL | Status: DC
  Administered 2015-11-07 – 2015-11-09 (×3): 40 mg via ORAL
  Filled 2015-11-07 (×3): qty 20

## 2015-11-07 NOTE — Progress Notes (Signed)
PHARMACIST - PHYSICIAN COMMUNICATION  CONCERNING: IV to Oral Route Change Policy  RECOMMENDATION: This patient is receiving PROTONIX by the intravenous route.  Based on criteria approved by the Pharmacy and Therapeutics Committee, the intravenous medication(s) is/are being converted to the equivalent oral dose form(s).   DESCRIPTION: These criteria include:  The patient is eating (either orally or via tube) and/or has been taking other orally administered medications for a least 24 hours  The patient has no evidence of active gastrointestinal bleeding or impaired GI absorption (gastrectomy, short bowel, patient on TNA or NPO).  If you have questions about this conversion, please contact the Pharmacy Department  []   [phone]( [phone] )  [facility] [x]   [phone]( [phone] )  [facility] []   [phone]( [phone] )  [facility] []   [phone]( [phone] )  [facility] []   [phone]( [phone] )  [facility]   Kelli Robeck A, [facility] Center[facility]PH 11/07/2015 2:12 PM

## 2015-11-07 NOTE — Progress Notes (Signed)
Pharmacy Antibiotic Note  Ian [name] is a 65 y.o. male admitted on 11/04/2015 with bacteremia/UTI.  Pharmacy has been consulted for ceftriaxone dosing.  Plan: BCID results were discussed with Dr. Renae GlossWieting and the patient's antibiotics will be changed to ceftriaxone 2g q 24hr. Further narrowing will be determined based on finalized susceptibilities.  Recommended meropenem incase ESBL, Dr. Renae GlossWieting wished to keep ceftriaxone and wait for sensitivities  Height: 5\' 7"  (170.2 cm) Weight: 90 lb (40.824 kg) IBW/kg (Calculated) : 66.1  Temp (24hrs), Avg:97.5 F (36.4 C), Min:97.3 F (36.3 C), Max:97.7 F (36.5 C)   Recent Labs Lab 11/04/15 1737 11/04/15 2319 11/05/15 0250 11/06/15 0419 11/07/15 0555  WBC 10.9*  --  8.3 14.3* 15.5*  CREATININE 0.63  --  0.57* 0.60* 0.50*  LATICACIDVEN 1.7 3.6* 2.5*  --   --     Estimated Creatinine Clearance: 54.5 mL/min (by C-G formula based on Cr of 0.5).    No Known Allergies  Antimicrobials this admission: 3/8 ceftriaxone >>    Dose adjustments this admission: Ceftriaxone 1 g increased to 2g q 24  Microbiology results: 3/8 BCx: ecoli Ucx= E.coli   Thank you for allowing pharmacy to be a part of this patient's care.  Shray Hunley A 11/07/2015 11:41 AM

## 2015-11-07 NOTE — Progress Notes (Signed)
Spoke to patients sister.  This family member is concerned about the patients safety at discharge. MD notified. Orders for social worker consult given. Will continue to assess.

## 2015-11-07 NOTE — Progress Notes (Signed)
Patient ID: Ian [name], male   DOB: May 23, 1951, 65 y.o.   MRN: [id number] [facility] PROGRESS NOTE  Ian [name] [id number] DOB: May 23, 1951 DOA: 11/04/2015 PCP: Rico JunkerHANNE, [name], PA  HPI/Subjective:    Patient does not answer any questions. But he was talking to the aide earlier. Currently eating.  Objective: Filed Vitals:   11/06/15 2022 11/07/15 0412  BP: 133/84 153/93  Pulse: 76 85  Temp: 97.6 F (36.4 C) 97.7 F (36.5 C)  Resp: 18 17    Intake/Output Summary (Last 24 hours) at 11/07/15 0848 Last data filed at 11/06/15 2041  Gross per 24 hour  Intake    480 ml  Output      0 ml  Net    480 ml   Filed Weights   11/04/15 1734  Weight: 40.824 kg (90 lb)    ROS: Review of Systems  Unable to perform ROS Patient upset about his positioning in the bed and did not want to speak with me Exam: Physical Exam  Constitutional: He appears cachectic.  chronically ill-appearing HENT:  Nose: No mucosal edema.  Mouth/Throat: No oropharyngeal exudate or posterior oropharyngeal edema.  Dry, thrush  Eyes: Lids are normal. Pupils are equal, round, and reactive to light.  Jaundice  Neck: No JVD present. Carotid bruit is not present. No edema present. No thyroid mass and no thyromegaly present.  Cardiovascular: S1 normal and S2 normal.    Exam reveals no gallop.   No murmur heard. Pulses:      Dorsalis pedis pulses are 2+ on the right side, and 2+ on the left side.  Respiratory: No respiratory distress. He has no wheezes. He has no rhonchi. He has no rales.  GI: Soft. Bowel sounds are normal. There is no tenderness.  Musculoskeletal:       Right ankle: He exhibits swelling.       Left ankle: He exhibits swelling.  Lymphadenopathy:    He has no cervical adenopathy.  Neurological: He is alert.  Able to move extremities  Skin: Skin is warm. Nails show no clubbing.  Psychiatric: Appears upset about being in the hospital     Data Reviewed: Basic  Metabolic Panel:  Recent Labs Lab 11/04/15 1737 11/05/15 0250 11/06/15 0419  NA 137 139 134*  K 3.4* 3.4* 3.2*  CL 100* 107 104  CO2 29 25 24   GLUCOSE 55* 79 136*  BUN 42* 38* 31*  CREATININE 0.63 0.57* 0.60*  CALCIUM 8.4* 8.0* 7.6*   Liver Function Tests:  Recent Labs Lab 11/04/15 1737 11/05/15 2112 11/06/15 0419  AST 333* 97* 78*  ALT 185* 108* 96*  ALKPHOS 706* 448* 445*  BILITOT 6.7* 5.5* 6.2*  PROT 6.3* 5.4* 5.3*  ALBUMIN 2.0* 1.8* 1.7*    Recent Labs Lab 11/04/15 1737  LIPASE <10*    Recent Labs Lab 11/04/15 1737 11/06/15 0419  AMMONIA <9* 31   CBC:  Recent Labs Lab 11/04/15 1737 11/05/15 0250 11/06/15 0419 11/07/15 0555  WBC 10.9* 8.3 14.3* 15.5*  NEUTROABS 9.2*  --   --   --   HGB 9.9* 9.1* 8.3* 8.0*  HCT 29.8* 27.3* 25.2* 23.7*  MCV 85.1 86.3 86.6 85.9  PLT 22* 17* 11* 23*   Cardiac Enzymes:  Recent Labs Lab 11/04/15 1737 11/04/15 2319 11/05/15 0250 11/05/15 0630 11/05/15 2112  CKTOTAL  --   --   --   --  92  TROPONINI 0.17* 0.16* 0.44* 0.73*  --  CBG:  Recent Labs Lab 11/06/15 2225 11/06/15 2354 11/07/15 0206 11/07/15 0410 11/07/15 0741  GLUCAP 91 84 92 109* 82      Studies: No results found.  Scheduled Meds: . antiseptic oral rinse  7 mL Mouth Rinse BID  . cefTRIAXone (ROCEPHIN)  IV  2 g Intravenous Q24H  . cloNIDine  0.1 mg Transdermal Weekly  . feeding supplement (ENSURE ENLIVE)  237 mL Oral TID WC  . lipase/protease/amylase  24,000 Units Oral TID WC  . pantoprazole (PROTONIX) IV  40 mg Intravenous Q24H  . phytonadione  5 mg Oral Daily  . sodium chloride flush  3 mL Intravenous Q12H   Continuous Infusions:    Assessment/Plan:  1.  Bacteremia with Escherichia coli. Source urinary, WBC count still elevated I will repeat urine cultures. Follow CBC 2. Severe hypoglycemia with liver failure, acute encephalopathy. Blood glucose improved. D5   3.  Alcoholic cirrhosis, increased liver function test,  jaundice, thrombocytopenia, coagulopathy, cachexia. Overall prognosis is poor.  4. 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 History of hypertension on clonidine patch 6. Urinary tract infection with hematuria repeat urine cultures 7. Elevated troponin likely demand ischemia.  We'll discontinue telemetry 8. History of hepatitis C seen by GI supportive care recommended  9. History of neuropathy 10. Overall prognosis is very poor. May need rehabilitation  Code full  Code Status:     Code Status Orders        Start     Ordered   11/04/15 2157  Full code   Continuous     11/04/15 2156    Code Status History    Date Active Date Inactive Code Status Order ID Comments User Context   This patient has a current code status but no historical code status.     Family Communication: Dr. Fonnie Birkenhead discussed with the daughter she would like everything done at this point continue supportive care and current treatment  Disposition Plan: To be determined  Consultants:  Palliative care  Antibiotics:  Rocephin  Time spent: 30 minutes.   Allena Katz [facility]  [facility]

## 2015-11-07 NOTE — Progress Notes (Addendum)
Clinical Social Worker (CSW) received consult today 11/07/15 for "Sister concerned about patient safety after discharge. All of the patients children, one of which is making medical decisions for patient, are incarcerated. Family members (pts sisters) are concerned about patients daughter pulling him out of short term rehab, etc."   CSW has already addressed these concerns per CSW note on 11/05/15. Patient is currently refusing to go to SNF. CSW made RN aware that we cannot send patient to SNF against his will unless an MD determines and documents that patient does not have capacity to make decisions for himself.  Per MD note on 11/07/15 "Pt is incompetent to make any decisions on his own. He is confused and with his alcohol abuse not capable of making any decisions." CSW made [place] Adult Management [facility]ctive Services ([facility]) [place]. At this point it is unclear who will make decisions for patient. CSW will continue to follow and assist as needed.    On call APS worker called CSW back and reported that they will need a pysch evaluation with a diagnosis about patient lacking capacity. CSW made RN aware.   Jetta LoutBailey Morgan, LCSW [phone]

## 2015-11-07 NOTE — Progress Notes (Signed)
Pt is incompetent to make any decisions on his own. He is confused and with his alcohol abuse not capable of making any decisions.

## 2015-11-07 NOTE — Progress Notes (Signed)
MD notified. Pt has every 2 hours CBGs, MD reordered CBGs every 6 hours. Will continue to monitor.

## 2015-11-07 NOTE — Progress Notes (Signed)
Pt is more anxious and combative at times. MD notified. Orders received. I will continue to assess.

## 2015-11-08 DIAGNOSIS — F1210 Cannabis abuse, uncomplicated: Secondary | ICD-10-CM

## 2015-11-08 DIAGNOSIS — F0391 Unspecified dementia with behavioral disturbance: Secondary | ICD-10-CM

## 2015-11-08 DIAGNOSIS — F03918 Unspecified dementia, unspecified severity, with other behavioral disturbance: Secondary | ICD-10-CM

## 2015-11-08 DIAGNOSIS — F1010 Alcohol abuse, uncomplicated: Secondary | ICD-10-CM

## 2015-11-08 DIAGNOSIS — F0280 Dementia in other diseases classified elsewhere without behavioral disturbance: Secondary | ICD-10-CM

## 2015-11-08 DIAGNOSIS — F1410 Cocaine abuse, uncomplicated: Secondary | ICD-10-CM

## 2015-11-08 LAB — PREPARE PLATELET PHERESIS: UNIT DIVISION: 0

## 2015-11-08 LAB — CBC
HEMATOCRIT: 23.8 % — AB (ref 40.0–52.0)
Hemoglobin: 8 g/dL — ABNORMAL LOW (ref 13.0–18.0)
MCH: 28.4 pg (ref 26.0–34.0)
MCHC: 33.5 g/dL (ref 32.0–36.0)
MCV: 84.9 fL (ref 80.0–100.0)
PLATELETS: 18 10*3/uL — AB (ref 150–440)
RBC: 2.8 MIL/uL — ABNORMAL LOW (ref 4.40–5.90)
RDW: 15.4 % — AB (ref 11.5–14.5)
WBC: 14.3 10*3/uL — AB (ref 3.8–10.6)

## 2015-11-08 LAB — BASIC METABOLIC PANEL
ANION GAP: 5 (ref 5–15)
BUN: 38 mg/dL — AB (ref 6–20)
CALCIUM: 7.9 mg/dL — AB (ref 8.9–10.3)
CO2: 27 mmol/L (ref 22–32)
Chloride: 103 mmol/L (ref 101–111)
Creatinine, Ser: 0.42 mg/dL — ABNORMAL LOW (ref 0.61–1.24)
GFR calc Af Amer: >60 mL/min (ref >60)
GLUCOSE: 98 mg/dL (ref 65–99)
Potassium: 4.3 mmol/L (ref 3.5–5.1)
Sodium: 135 mmol/L (ref 135–145)

## 2015-11-08 LAB — GLUCOSE, CAPILLARY
GLUCOSE-CAPILLARY: 116 mg/dL — AB (ref 65–99)
GLUCOSE-CAPILLARY: 136 mg/dL — AB (ref 65–99)
Glucose-Capillary: 91 mg/dL (ref 65–99)

## 2015-11-08 LAB — MAGNESIUM: Magnesium: 1.4 mg/dL — ABNORMAL LOW (ref 1.7–2.4)

## 2015-11-08 MED ORDER — MAGNESIUM OXIDE 400 (241.3 MG) MG PO TABS: 400.0000 mg | ORAL_TABLET | ORAL | Status: DC | PRN

## 2015-11-08 MED ORDER — NYSTATIN 100000 UNIT/ML MT SUSP
5.0000 mL | Freq: Four times a day (QID) | OROMUCOSAL | Status: DC
  Administered 2015-11-08 – 2015-11-10 (×7): 500000 [IU] via OROMUCOSAL
  Filled 2015-11-08 (×12): qty 5

## 2015-11-08 MED ORDER — OXYCODONE HCL 5 MG PO TABS
5.0000 mg | ORAL_TABLET | ORAL | Status: DC | PRN
  Administered 2015-11-08 – 2015-11-09 (×5): 5 mg via ORAL
  Filled 2015-11-08 (×5): qty 1

## 2015-11-08 MED ORDER — MEGESTROL ACETATE 400 MG/10ML PO SUSP
400.0000 mg | Freq: Two times a day (BID) | ORAL | Status: DC
  Administered 2015-11-08 – 2015-11-10 (×5): 400 mg via ORAL
  Filled 2015-11-08 (×5): qty 10

## 2015-11-08 MED ORDER — OXYCODONE HCL 5 MG PO TABS
5.0000 mg | ORAL_TABLET | Freq: Four times a day (QID) | ORAL | Status: DC | PRN
  Administered 2015-11-08 (×2): 5 mg via ORAL
  Filled 2015-11-08 (×3): qty 1

## 2015-11-08 MED ORDER — CEFUROXIME AXETIL 500 MG PO TABS
500.0000 mg | ORAL_TABLET | Freq: Two times a day (BID) | ORAL | Status: DC
  Administered 2015-11-08 – 2015-11-10 (×4): 500 mg via ORAL
  Filled 2015-11-08 (×4): qty 1

## 2015-11-08 NOTE — Progress Notes (Signed)
Pt displays pain when manipulated. Pt is on a every 2 hour turn and this is very painful for the patient. MD notified to increase frequency of pain medication to every 4 hours instead of every 6. MD give orders to change frequency. I will continue to assess.

## 2015-11-08 NOTE — Consult Note (Signed)
[facility] Consult   Reason for Consult:  Consult for 65 year old man currently in the hospital with multiple severe medical problems including advanced liver disease, recurrent bouts of severe hypoglycemia, malnutrition. Concern about capacity and desire to place him appropriately. 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 Dementia with behavioral disturbance [F03.91] 11/08/2015  . Cocaine abuse [F14.10] 11/08/2015  . Alcohol abuse [F10.10] 11/08/2015  . Cannabis abuse [F12.10] 11/08/2015  . 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 Protein-calorie malnutrition, severe [E43] 11/05/2015  . Abdominal pain [R10.9]   . Jaundice [R17] 11/04/2015    Total Time spent with patient: 45 minutes  Subjective:   Ian [name] is a 65 y.o. male patient admitted with patient was not able to give any information at all.  HPI:  Attempted to interview patient although he was not able to give any information at all despite multiple attempts to communicate with him. Some information obtained from the family who were present. Information obtained from review of the chart and conversation with social work. 66 year old man was brought to the hospital had altered mental status. Has multiple severe medical problems including advanced liver disease has had multiple episodes of extremely severe hypoglycemia. Patient was found to be positive for cocaine and cannabis on admission and all of his lab data is extremely consistent with recent abuse of alcohol. Patient was unable to be interviewed at all. Although he seemed to respond a little bit to verbal stimuli it was only by gently shaking his head. He could not articulate any words at all. Family told me that that is been how he has been all day and they  have not been able to communicate verbally with him either.  Medical history: Severe end-stage medical problems. He is profoundly malnourished. He has end-stage liver disease with cirrhosis. His platelet count is almost 0 despite transfusion. He is extremely anemic. Blood sugars at times have dropped down almost 0. Unclear if he was receiving any medical treatment outside the hospital.  Social history: Patient reportedly had been living with his sister. Family reports that at home he is barely able to move himself around. It is very unclear to me how somebody in such disabled condition could've been using cocaine and marijuana and alcohol in a manner that he obviously was.  Substance abuse history: Apparently active ongoing substance abuse and presumably long-term alcohol abuse to result in cirrhosis. Unclear if he's had any treatment in the past.  Past Psychiatric History: No identified past psychiatric treatment other than substance abuse  Risk to Self: Is patient at risk for suicide?: No Risk to Others:   Prior Inpatient Therapy:   Prior Outpatient Therapy:    Past Medical History:  Past Medical History  Diagnosis Date  . Diabetes mellitus   . HTN (hypertension)   . Cirrhosis of liver ([place])   . Hepatitis C   . Neuropathy ([place])   . Cirrhosis [facility])     Past Surgical History  Procedure Laterality Date  . Mandible surgery    . Dental surgery      teeth extraction   Family History: History reviewed. No pertinent family history. Family Psychiatric  History: Apparently there is a family history positive for substance abuse. Social History:  History  Alcohol Use  . 1.8 oz/week  . 3 Cans  of beer per week     History  Drug Use No    Social History   Social History  . Marital Status: Single    Spouse Name: N/A  . Number of Children: N/A  . Years of Education: N/A   Social History Main Topics  . Smoking status: Current Every Day Smoker -- 0.50 packs/day    Types: Cigarettes   . Smokeless tobacco: Never Used  . Alcohol Use: 1.8 oz/week    3 Cans of beer per week  . Drug Use: No  . Sexual Activity: Not Asked   Other Topics Concern  . None   Social History Narrative   Living with daughter   Additional Social History:    Allergies:  No Known Allergies  Labs:  Results for orders placed or performed during the hospital encounter of 11/04/15 (from the past 48 hour(s))  Glucose, capillary     Status: Abnormal   Collection Time: 11/06/15  6:23 PM  Result Value Ref Range   Glucose-Capillary 142 (H) 65 - 99 mg/dL   Comment 1 Notify RN   Glucose, capillary     Status: Abnormal   Collection Time: 11/06/15  8:24 PM  Result Value Ref Range   Glucose-Capillary 114 (H) 65 - 99 mg/dL  Glucose, capillary     Status: None   Collection Time: 11/06/15 10:25 PM  Result Value Ref Range   Glucose-Capillary 91 65 - 99 mg/dL  Glucose, capillary     Status: None   Collection Time: 11/06/15 11:54 PM  Result Value Ref Range   Glucose-Capillary 84 65 - 99 mg/dL  Glucose, capillary     Status: None   Collection Time: 11/07/15  2:06 AM  Result Value Ref Range   Glucose-Capillary 92 65 - 99 mg/dL  Glucose, capillary     Status: Abnormal   Collection Time: 11/07/15  4:10 AM  Result Value Ref Range   Glucose-Capillary 109 (H) 65 - 99 mg/dL  Comprehensive metabolic panel     Status: Abnormal   Collection Time: 11/07/15  5:55 AM  Result Value Ref Range   Sodium 132 (L) 135 - 145 mmol/L   Potassium 4.4 3.5 - 5.1 mmol/L   Chloride 101 101 - 111 mmol/L   CO2 25 22 - 32 mmol/L   Glucose, Bld 92 65 - 99 mg/dL   BUN 34 (H) 6 - 20 mg/dL   Creatinine, Ser 0.50 (L) 0.61 - 1.24 mg/dL   Calcium 8.0 (L) 8.9 - 10.3 mg/dL   Total Protein 5.2 (L) 6.5 - 8.1 g/dL   Albumin 1.7 (L) 3.5 - 5.0 g/dL   AST 172 (H) 15 - 41 U/L   ALT 105 (H) 17 - 63 U/L   Alkaline Phosphatase 833 (H) 38 - 126 U/L   Total Bilirubin 7.7 (H) 0.3 - 1.2 mg/dL   GFR calc non Af Amer >60 >60 mL/min   GFR calc  Af Amer >60 >60 mL/min    Comment: (NOTE) The eGFR has been calculated using the CKD EPI equation. This calculation has not been validated in all clinical situations. eGFR's persistently <60 mL/min signify possible Chronic Kidney Disease.    Anion gap 6 5 - 15  CBC     Status: Abnormal   Collection Time: 11/07/15  5:55 AM  Result Value Ref Range   WBC 15.5 (H) 3.8 - 10.6 K/uL   RBC 2.76 (L) 4.40 - 5.90 MIL/uL   Hemoglobin 8.0 (L) 13.0 - 18.0 g/dL  HCT 23.7 (L) 40.0 - 52.0 %   MCV 85.9 80.0 - 100.0 fL   MCH 29.2 26.0 - 34.0 pg   MCHC 34.0 32.0 - 36.0 g/dL   RDW 15.2 (H) 11.5 - 14.5 %   Platelets 23 (LL) 150 - 440 K/uL    Comment: RESULT REPEATED AND VERIFIED CRITICAL VALUE NOTED.  VALUE IS CONSISTENT WITH PREVIOUSLY REPORTED AND CALLED VALUE.   Glucose, capillary     Status: None   Collection Time: 11/07/15  7:41 AM  Result Value Ref Range   Glucose-Capillary 82 65 - 99 mg/dL  Glucose, capillary     Status: None   Collection Time: 11/07/15 11:41 AM  Result Value Ref Range   Glucose-Capillary 95 65 - 99 mg/dL  Glucose, capillary     Status: Abnormal   Collection Time: 11/07/15  5:28 PM  Result Value Ref Range   Glucose-Capillary 126 (H) 65 - 99 mg/dL   Comment 1 Notify RN    Comment 2 Document in Chart   Glucose, capillary     Status: None   Collection Time: 11/07/15 11:49 PM  Result Value Ref Range   Glucose-Capillary 89 65 - 99 mg/dL   Comment 1 Notify RN   CBC     Status: Abnormal   Collection Time: 11/08/15  5:23 AM  Result Value Ref Range   WBC 14.3 (H) 3.8 - 10.6 K/uL   RBC 2.80 (L) 4.40 - 5.90 MIL/uL   Hemoglobin 8.0 (L) 13.0 - 18.0 g/dL   HCT 23.8 (L) 40.0 - 52.0 %   MCV 84.9 80.0 - 100.0 fL   MCH 28.4 26.0 - 34.0 pg   MCHC 33.5 32.0 - 36.0 g/dL   RDW 15.4 (H) 11.5 - 14.5 %   Platelets 18 (LL) 150 - 440 K/uL    Comment: PLATELET COUNT CONFIRMED BY SMEAR CRITICAL VALUE NOTED.  VALUE IS CONSISTENT WITH PREVIOUSLY REPORTED AND CALLED VALUE.   Basic  metabolic panel     Status: Abnormal   Collection Time: 11/08/15  5:23 AM  Result Value Ref Range   Sodium 135 135 - 145 mmol/L   Potassium 4.3 3.5 - 5.1 mmol/L   Chloride 103 101 - 111 mmol/L   CO2 27 22 - 32 mmol/L   Glucose, Bld 98 65 - 99 mg/dL   BUN 38 (H) 6 - 20 mg/dL   Creatinine, Ser 0.42 (L) 0.61 - 1.24 mg/dL   Calcium 7.9 (L) 8.9 - 10.3 mg/dL   GFR calc non Af Amer >60 >60 mL/min   GFR calc Af Amer >60 >60 mL/min    Comment: (NOTE) The eGFR has been calculated using the CKD EPI equation. This calculation has not been validated in all clinical situations. eGFR's persistently <60 mL/min signify possible Chronic Kidney Disease.    Anion gap 5 5 - 15  Magnesium     Status: Abnormal   Collection Time: 11/08/15  5:23 AM  Result Value Ref Range   Magnesium 1.4 (L) 1.7 - 2.4 mg/dL  Glucose, capillary     Status: None   Collection Time: 11/08/15  6:50 AM  Result Value Ref Range   Glucose-Capillary 91 65 - 99 mg/dL   Comment 1 Notify RN   Glucose, capillary     Status: Abnormal   Collection Time: 11/08/15 11:41 AM  Result Value Ref Range   Glucose-Capillary 116 (H) 65 - 99 mg/dL   Comment 1 Notify RN     Current Facility-Administered Medications  Medication Dose Route Frequency Provider Last Rate Last Dose  . acetaminophen (TYLENOL) tablet 650 mg  650 mg Oral Q6H PRN Gladstone Lighter, MD       Or  . acetaminophen (TYLENOL) suppository 650 mg  650 mg Rectal Q6H PRN Gladstone Lighter, MD      . antiseptic oral rinse (CPC / CETYLPYRIDINIUM CHLORIDE 0.05%) solution 7 mL  7 mL Mouth Rinse BID Loletha Grayer, MD   7 mL at 11/08/15 0852  . cefUROXime (CEFTIN) tablet 500 mg  500 mg Oral BID WC Dustin Flock, MD      . cloNIDine (CATAPRES - Dosed in mg/24 hr) patch 0.1 mg  0.1 mg Transdermal Weekly Gladstone Lighter, MD   0.1 mg at 11/05/15 0004  . feeding supplement (ENSURE ENLIVE) (ENSURE ENLIVE) liquid 237 mL  237 mL Oral TID WC Loletha Grayer, MD   237 mL at 11/08/15 1200   . hydrALAZINE (APRESOLINE) injection 10 mg  10 mg Intravenous Q6H PRN Gladstone Lighter, MD      . lipase/protease/amylase (CREON) capsule 24,000 Units  24,000 Units Oral TID WC Lollie Sails, MD   24,000 Units at 11/08/15 1349  . LORazepam (ATIVAN) tablet 1 mg  1 mg Oral BID PRN Hillary Bow, MD   1 mg at 11/07/15 1905  . magnesium oxide (MAG-OX) tablet 400 mg  400 mg Oral Q4H PRN Dustin Flock, MD      . megestrol (MEGACE) 400 MG/10ML suspension 400 mg  400 mg Oral BID Dustin Flock, MD   400 mg at 11/08/15 1349  . morphine 2 MG/ML injection 2 mg  2 mg Intravenous Q4H PRN Gladstone Lighter, MD   2 mg at 11/07/15 1728  . nystatin (MYCOSTATIN) 100000 UNIT/ML suspension 500,000 Units  5 mL Mouth/Throat QID Dustin Flock, MD   500,000 Units at 11/08/15 1349  . ondansetron (ZOFRAN) tablet 4 mg  4 mg Oral Q6H PRN Gladstone Lighter, MD       Or  . ondansetron (ZOFRAN) injection 4 mg  4 mg Intravenous Q6H PRN Gladstone Lighter, MD      . oxyCODONE (Oxy IR/ROXICODONE) immediate release tablet 5 mg  5 mg Oral Q4H PRN Dustin Flock, MD      . pantoprazole sodium (PROTONIX) 40 mg/20 mL oral suspension 40 mg  40 mg Oral Daily Christiane Landis Martins, NP   40 mg at 11/07/15 1830  . phytonadione (VITAMIN K) tablet 5 mg  5 mg Oral Daily Loletha Grayer, MD   5 mg at 11/08/15 0851  . sodium chloride 0.9 % bolus 1,000 mL  1,000 mL Intravenous PRN Lance Coon, MD   1,000 mL at 11/05/15 0002  . sodium chloride flush (NS) 0.9 % injection 3 mL  3 mL Intravenous Q12H Gladstone Lighter, MD   3 mL at 11/07/15 0916    Musculoskeletal: Strength & Muscle Tone: decreased and atrophy Gait & Station: unable to stand Patient leans: Backward  Psychiatric Specialty Exam: Review of Systems  Unable to perform ROS: mental acuity    Blood pressure 124/84, pulse 98, temperature 97.7 F (36.5 C), temperature source Oral, resp. rate 18, height _0  (1.702 m), weight 40.824 kg (90 lb), SpO2 98 %.Body mass index is  14.09 kg/(m^2).  General Appearance: Cachectic extremely sick  Eye Contact::  None  Speech:  Negative  Volume:  Did not speak  Mood:  Negative  Affect:  Flat  Thought Process:  Negative  Orientation:  Negative  Thought Content:  Negative  Suicidal  Thoughts:  Unknown  Homicidal Thoughts:  Unknown  Memory:  Negative  Judgement:  Negative  Insight:  Negative  Psychomotor Activity:  Negative  Concentration:  Negative  Recall:  Negative  Fund of Knowledge:Negative  Language: Negative  Akathisia:  Negative  Handed:  Right  AIMS (if indicated):     Assets:  Social Support  ADL's:  Impaired  Cognition: Impaired,  Severe  Sleep:      Treatment Plan Summary: Plan The primary question of this consult was capacity to make decisions. Based on my interview today the patient clearly has no capacity at all. He was not able to engage in any conversation whatsoever. His diagnosis psychiatrically at this time will be dementia severe related to general medical condition multiple medical problems dehydration probably also resulting from long-standing alcohol abuse also from recurrent hypoglycemia. Diagnoses also of cocaine and cannabis and alcohol abuse. Based on the severity of his condition I would say that it is very unlikely that he will ever regain capacity to make decisions and that if he were to return to a situation in which she is able to abuse alcohol and drugs it is essentially certain that he will not only never regaining capacity but probably die quite soon. I have communicated this to social work. They are working with the family and hospital resources to try and come up with appropriate eventual disposition. At this point there is no indication for any kind of specific psychiatric treatment. Therefore I will sign off unless further assistance is required.  Disposition: See note above  Alethia Berthold, MD 11/08/2015 4:23 PM

## 2015-11-08 NOTE — Progress Notes (Signed)
Patient ID: Ian PellantBobby L Beals, male   DOB: 1951/05/27, 65 y.o.   MRN: [id number] [facility] PROGRESS NOTE  Joette CatchingBobby L Herschberger AOZ:[id number] DOB: 1951/05/27 DOA: 11/04/2015 PCP: Rico JunkerHANNE, [name], PA  HPI/Subjective:    Patient not eating much. Pulled out his IVs.  Objective: Filed Vitals:   11/07/15 2006 11/08/15 0534  BP: 142/96 135/86  Pulse: 93 99  Temp: 97.4 F (36.3 C) 97.7 F (36.5 C)  Resp: 18 17    Intake/Output Summary (Last 24 hours) at 11/08/15 0901 Last data filed at 11/07/15 1630  Gross per 24 hour  Intake      0 ml  Output      0 ml  Net      0 ml   Filed Weights   11/04/15 1734  Weight: 40.824 kg (90 lb)    ROS: Review of Systems  Unable to perform ROS Patient upset about his positioning in the bed and did not want to speak with me Exam: Physical Exam  Constitutional: He appears cachectic.  chronically ill-appearing HENT:  Nose: No mucosal edema.  Mouth/Throat: No oropharyngeal exudate or posterior oropharyngeal edema.  Dry, thrush  Eyes: Lids are normal. Pupils are equal, round, and reactive to light.  Jaundice  Neck: No JVD present. Carotid bruit is not present. No edema present. No thyroid mass and no thyromegaly present.  Cardiovascular: S1 normal and S2 normal.    Exam reveals no gallop.   No murmur heard. Pulses:      Dorsalis pedis pulses are 2+ on the right side, and 2+ on the left side.  Respiratory: No respiratory distress. He has no wheezes. He has no rhonchi. He has no rales.  GI: Soft. Bowel sounds are normal. There is no tenderness.  Musculoskeletal:       Right ankle: He exhibits swelling.       Left ankle: He exhibits swelling.  Lymphadenopathy:    He has no cervical adenopathy.  Neurological: He is alert.  Able to move extremities  Skin: Skin is warm. Nails show no clubbing.  Psychiatric: Anxious and up     Data Reviewed: Basic Metabolic Panel:  Recent Labs Lab 11/04/15 1737 11/05/15 0250 11/06/15 0419  11/07/15 0555 11/08/15 0523  NA 137 139 134* 132* 135  K 3.4* 3.4* 3.2* 4.4 4.3  CL 100* 107 104 101 103  CO2 29 25 24 25 27   GLUCOSE 55* 79 136* 92 98  BUN 42* 38* 31* 34* 38*  CREATININE 0.63 0.57* 0.60* 0.50* 0.42*  CALCIUM 8.4* 8.0* 7.6* 8.0* 7.9*  MG  --   --   --   --  1.4*   Liver Function Tests:  Recent Labs Lab 11/04/15 1737 11/05/15 2112 11/06/15 0419 11/07/15 0555  AST 333* 97* 78* 172*  ALT 185* 108* 96* 105*  ALKPHOS 706* 448* 445* 833*  BILITOT 6.7* 5.5* 6.2* 7.7*  PROT 6.3* 5.4* 5.3* 5.2*  ALBUMIN 2.0* 1.8* 1.7* 1.7*    Recent Labs Lab 11/04/15 1737  LIPASE <10*    Recent Labs Lab 11/04/15 1737 11/06/15 0419  AMMONIA <9* 31   CBC:  Recent Labs Lab 11/04/15 1737 11/05/15 0250 11/06/15 0419 11/07/15 0555 11/08/15 0523  WBC 10.9* 8.3 14.3* 15.5* 14.3*  NEUTROABS 9.2*  --   --   --   --   HGB 9.9* 9.1* 8.3* 8.0* 8.0*  HCT 29.8* 27.3* 25.2* 23.7* 23.8*  MCV 85.1 86.3 86.6 85.9 84.9  PLT 22* 17* 11* 23* 18*  Cardiac Enzymes:  Recent Labs Lab 11/04/15 1737 11/04/15 2319 11/05/15 0250 11/05/15 0630 11/05/15 2112  CKTOTAL  --   --   --   --  92  TROPONINI 0.17* 0.16* 0.44* 0.73*  --     CBG:  Recent Labs Lab 11/07/15 0741 11/07/15 1141 11/07/15 1728 11/07/15 2349 11/08/15 0650  GLUCAP 82 95 126* 89 91      Studies: No results found.  Scheduled Meds: . antiseptic oral rinse  7 mL Mouth Rinse BID  . cefTRIAXone (ROCEPHIN)  IV  2 g Intravenous Q24H  . cloNIDine  0.1 mg Transdermal Weekly  . feeding supplement (ENSURE ENLIVE)  237 mL Oral TID WC  . lipase/protease/amylase  24,000 Units Oral TID WC  . nystatin  5 mL Mouth/Throat QID  . pantoprazole sodium  40 mg Oral Daily  . phytonadione  5 mg Oral Daily  . sodium chloride flush  3 mL Intravenous Q12H   Continuous Infusions:    Assessment/Plan:  1.  Bacteremia with Escherichia coli. Source urinary, changed to oral antibiotics due to loss of IV  2. Severe  hypoglycemia with liver failure, acute encephalopathy. Blood glucose improved. Off d5 3.  Alcoholic cirrhosis, increased liver function test, jaundice, thrombocytopenia, coagulopathy, cachexia. Overall prognosis is poor.  4. 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 History of hypertension on clonidine patch 6. Urinary tract infection with hematuria  7. Elevated troponin likely demand ischemia.  8. History of hepatitis C seen by GI supportive care recommended  9. History of neuropathy 10. Overall prognosis is very poor. May need rehabilitation patient refuses rehabilitation he is incompetent to make decisions. Patient will need DSS involvement. We'll have psychiatry evaluate the patient  Code full  Code Status:     Code Status Orders        Start     Ordered   11/04/15 2157  Full code   Continuous     11/04/15 2156    Code Status History    Date Active Date Inactive Code Status Order ID Comments User Context   This patient has a current code status but no historical code status.     Family Communication: Dr. Fonnie Birkenhead discussed with the daughter she would like everything done at this point continue supportive care and current treatment  Disposition Plan: To be determined  Consultants:  Palliative care  Antibiotics:  Rocephin  Time spent: 25 minutes.   Allena Katz [facility]  [facility]
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
## 2015-11-09 LAB — GLUCOSE, CAPILLARY
GLUCOSE-CAPILLARY: 126 mg/dL — AB (ref 65–99)
Glucose-Capillary: 163 mg/dL — ABNORMAL HIGH (ref 65–99)
Glucose-Capillary: 186 mg/dL — ABNORMAL HIGH (ref 65–99)
Glucose-Capillary: 134 mg/dL — ABNORMAL HIGH (ref 65–99)

## 2015-11-09 LAB — CBC
HCT: 23.3 % — ABNORMAL LOW (ref 40.0–52.0)
HEMOGLOBIN: 7.6 g/dL — AB (ref 13.0–18.0)
MCH: 27.7 pg (ref 26.0–34.0)
MCHC: 32.8 g/dL (ref 32.0–36.0)
MCV: 84.5 fL (ref 80.0–100.0)
Platelets: 17 10*3/uL — CL (ref 150–440)
RBC: 2.76 MIL/uL — ABNORMAL LOW (ref 4.40–5.90)
RDW: 15.1 % — AB (ref 11.5–14.5)
WBC: 14.2 10*3/uL — AB (ref 3.8–10.6)

## 2015-11-09 MED ORDER — CITALOPRAM HYDROBROMIDE 20 MG PO TABS
10.0000 mg | ORAL_TABLET | Freq: Every day | ORAL | Status: DC
  Administered 2015-11-09 – 2015-11-10 (×2): 10 mg via ORAL
  Filled 2015-11-09 (×2): qty 1

## 2015-11-09 NOTE — Progress Notes (Signed)
PT Cancellation Note  Patient Details Name: Ian [name] MRN: [id number] DOB: 1951-08-12   Cancelled Treatment:    Reason Eval/Treat Not Completed: Other (comment) (Treatment session attempted.  Patient resting in fetal position upon entry to room.  Open eyes and acknowledges therapist, but does not demonstrate ability to follow commands or actively participate with therapeutic interventions at this time.  Noted facial grimacing with attempted movement/repositioning of patient; patient immediately pulling extremities back into fetal position.  Slightly agitated with efforts to promote participation.  Will re-attempt at later time/date as patient medically appropriate and able to actively participate.  Will carefully monitor progress and hospital course; question ability to participate/progress with formal, structured STR at this time.)   Miski Feldpausch H. Manson PasseyBrown, PT, DPT, NCS 11/09/2015, 10:43 AM [phone]

## 2015-11-09 NOTE — Progress Notes (Signed)
Notified of Platelet count 18.  Dr Sheryle Hailiamond on unit and made aware.  No active bleeding noted.  No further orders at this time.

## 2015-11-09 NOTE — Progress Notes (Signed)
CSW spoke to RN. Per RN she reports that patient has a son [facility] named DulacMarlow Shank. CSW contacted patient's sister Delorse LekCarolyn Kedzierski to obtain patient's other children's name. Informed her that CSW cannot discuss patient information with her at this time. She requested CSW to speak to patient's niece Lajean Manes(Gwendloyn Adams [phone]). Per Elinor DodgeGwendolyn patient's older sons are Genelle GatherMarlow Earwood [facility] Inmate  #[id number] and Davina PokeBobby McCaden [facility] Inmate [phone]. Gwendolyn reports that she visits Marlow Prim every Friday. Provided his Lawyer Robinette Haines(Rick Champion [phone]). CSW attempted to contact [facility] to spaek to Peabody EnergyBobby McCaden. Per operator CSW would need to speak to his case worker [phone]) Adelfa KohPamela Blackman. Reports that Case worker will not be into the facility until after 12PM.   CSW spoke to APS worker Irving [facility]Scott [facility]. He came to evaluate patient. Per Lorin PicketScott he will begin guardianship paperwork but noted it could be a "lenthy" process. Requested CSW to contact him if she contacts one of patient's sons. Provided his cell phone [phone]. CSW will continue to follow and assist.  Woodroe Modehristina Tamyrah Burbage, MSW, LCSW-A Clinical Social Work Department [phone]

## 2015-11-09 NOTE — Progress Notes (Signed)
Patient ID: Ian PellantBobby L [name], male   DOB: [birth year], 65 y.o.   MRN: [id number] [facility] PROGRESS NOTE  Ian CatchingBobby L [name] [id number] DOB: [birth year] DOA: 11/04/2015 PCP: Ian [name], Ian [name]  HPI/Subjective:    He does not answer any questions. Is not eating much or drinking much   Objective: Filed Vitals:   11/09/15 0415 11/09/15 1112  BP: 135/87 122/77  Pulse: 89 105  Temp: 98.2 F (36.8 C) 98.2 F (36.8 C)  Resp: 18 18    Intake/Output Summary (Last 24 hours) at 11/09/15 1316 Last data filed at 11/09/15 0957  Gross per 24 hour  Intake    120 ml  Output    725 ml  Net   -605 ml   Filed Weights   11/04/15 1734  Weight: 40.824 kg (90 lb)    ROS: Review of Systems  Unable to perform ROS  Exam: Physical Exam  Constitutional: He appears cachectic.  chronically ill-appearing HENT:  Nose: No mucosal edema.  Mouth/Throat: No oropharyngeal exudate or posterior oropharyngeal edema.  Dry, thrush  Eyes: Lids are normal. Pupils are equal, round, and reactive to light.  Jaundice  Neck: No JVD present. Carotid bruit is not present. No edema present. No thyroid mass and no thyromegaly present.  Cardiovascular: S1 normal and S2 normal.    Exam reveals no gallop.   No murmur heard. Pulses:      Dorsalis pedis pulses are 2+ on the right side, and 2+ on the left side.  Respiratory: No respiratory distress. He has no wheezes. He has no rhonchi. He has no rales.  GI: Soft. Bowel sounds are normal. There is no tenderness.  Musculoskeletal:       Right ankle: He exhibits swelling.       Left ankle: He exhibits swelling.  Lymphadenopathy:    He has no cervical adenopathy.  Neurological: Ian [name] drowsy  Able to move extremities  Skin: Skin is warm. Nails show no clubbing.  Psychiatric: Drowsy     Data Reviewed: Basic Metabolic Panel:  Recent Labs Lab 11/04/15 1737 11/05/15 0250 11/06/15 0419 11/07/15 0555 11/08/15 0523  NA 137 139 134* 132* 135  K  3.4* 3.4* 3.2* 4.4 4.3  CL 100* 107 104 101 103  CO2 29 25 24 25 27   GLUCOSE 55* 79 136* 92 98  BUN 42* 38* 31* 34* 38*  CREATININE 0.63 0.57* 0.60* 0.50* 0.42*  CALCIUM 8.4* 8.0* 7.6* 8.0* 7.9*  MG  --   --   --   --  1.4*   Liver Function Tests:  Recent Labs Lab 11/04/15 1737 11/05/15 2112 11/06/15 0419 11/07/15 0555  AST 333* 97* 78* 172*  ALT 185* 108* 96* 105*  ALKPHOS 706* 448* 445* 833*  BILITOT 6.7* 5.5* 6.2* 7.7*  PROT 6.3* 5.4* 5.3* 5.2*  ALBUMIN 2.0* 1.8* 1.7* 1.7*    Recent Labs Lab 11/04/15 1737  LIPASE <10*    Recent Labs Lab 11/04/15 1737 11/06/15 0419  AMMONIA <9* 31   CBC:  Recent Labs Lab 11/04/15 1737 11/05/15 0250 11/06/15 0419 11/07/15 0555 11/08/15 0523 11/09/15 0511  WBC 10.9* 8.3 14.3* 15.5* 14.3* 14.2*  NEUTROABS 9.2*  --   --   --   --   --   HGB 9.9* 9.1* 8.3* 8.0* 8.0* 7.6*  HCT 29.8* 27.3* 25.2* 23.7* 23.8* 23.3*  MCV 85.1 86.3 86.6 85.9 84.9 84.5  PLT 22* 17* 11* 23* 18* 17*   Cardiac Enzymes:  Recent Labs Lab 11/04/15  1737 11/04/15 2319 11/05/15 0250 11/05/15 0630 11/05/15 2112  CKTOTAL  --   --   --   --  92  TROPONINI 0.17* 0.16* 0.44* 0.73*  --     CBG:  Recent Labs Lab 11/08/15 1141 11/08/15 1828 11/09/15 0014 11/09/15 0534 11/09/15 1113  GLUCAP 116* 136* 126* 163* 186*      Studies: No results found.  Scheduled Meds: . antiseptic oral rinse  7 mL Mouth Rinse BID  . cefUROXime  500 mg Oral BID WC  . cloNIDine  0.1 mg Transdermal Weekly  . feeding supplement (ENSURE ENLIVE)  237 mL Oral TID WC  . lipase/protease/amylase  24,000 Units Oral TID WC  . megestrol  400 mg Oral BID  . nystatin  5 mL Mouth/Throat QID  . pantoprazole sodium  40 mg Oral Daily  . phytonadione  5 mg Oral Daily  . sodium chloride flush  3 mL Intravenous Q12H   Continuous Infusions:    Assessment/Plan:  1.  Bacteremia with Escherichia coli. Source urinary, continue oral anabiotic  2. Severe hypoglycemia with  liver failure, acute encephalopathy. Blood glucose improved. Off d5 3.  Alcoholic cirrhosis, supportive care 4. 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 History of hypertension on clonidine patch 6. Urinary tract infection with hematuria antibiotics as above 7. Elevated troponin likely demand ischemia.  8. History of hepatitis C seen by GI supportive care recommended  9. History of neuropathy 10. Overall prognosis is very poor.  Will need placement may be depressed I'll start him on Celexa  Code full  Code Status:     Code Status Orders        Start     Ordered   11/04/15 2157  Full code   Continuous     11/04/15 2156    Code Status History    Date Active Date Inactive Code Status Order ID Comments User Context   This patient has a current code status but no historical code status.     Family Communication: Dr. Fonnie Birkenhead discussed with the daughter she would like everything done at this point continue supportive care and current treatment  Disposition Plan: To be determined  Consultants:  Palliative care  Antibiotics:  Rocephin  Time spent: 20 minutes.   Allena Katz [facility]  [facility] Hospitalists

## 2015-11-09 NOTE — Progress Notes (Signed)
CSW received a call from APS Social Worker Scott suggesting that Ian [name] may be in some relations to patient. Reported she is listed in [place] DSS chart. Provided her phone number [phone]. CSW called Ms. [name]. She reports that she used to care for patient in the past. She stated that she is no relations to patient. CSW will continue to follow and assist.  Woodroe Modehristina Princella Jaskiewicz, MSW, LCSW-A Clinical Social Work Department [phone]

## 2015-11-09 NOTE — Progress Notes (Signed)
CSW called [phone] to set up a time to speak to patient's daughter (Myicia Conover/ Chestine Sporelark) about discharge plans. CSW was told that the number called was for [phone]. Operator provided [phone]. Left a voicemail for Valero EnergyCaptain James Rich. CSW awaiting phone call back. CSW will continue to follow and assist.  Woodroe Modehristina Joey Hudock, MSW, LCSW-A Clinical Social Work Department [phone]
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
## 2015-11-09 NOTE — Progress Notes (Addendum)
CSW spoke to Sun MicrosystemsDeborah- [facility]. Per Gavin Poundeborah the bed offer is still "good" for patient but he cannot be accepted until tomorrow 11-10-15.   CSW will present bed offers to Gypsy DecantGwendolyn Adams or Ryland GroupCarolyn Bassett tomorrow 11-10-15. CSW will continue to follow and assist.  Woodroe Modehristina Dreden Rivere, MSW, LCSW-A Clinical Social Work Department [phone]
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
## 2015-11-09 NOTE — Progress Notes (Signed)
CSW received a call from Lajoyce LauberCaptain Dickerson again requesting [facility]. CSW inquired about detention center calling [facility] directly and asking for me to verify my identity and to speak to patient's daughter. He reports that he'll call me back. CSW will continue to follow and assist.  Woodroe Modehristina Larren Copes, MSW, LCSW-A Clinical Social Work Department [phone]

## 2015-11-09 NOTE — Progress Notes (Addendum)
CSW attempted to call patient's son Ian [name]'s Case Manager- Ian [name] [phone]) to arrange a time to speak to [name]. No answer. No answering machine. CSW will attempt again.  CSW contacted patient's son Ian [name]. Per Raiford Nobleick with patient's son's legal issues he feels that it will be difficult to contact patient's sons or any incarcerated person to make decisions for patient.   CSW contacted Ian [name]- APS social worker. He requested documentation from MD stating that patient does not have capacity to make decisions. CSW faxed the requested information. Reported that he will try to begin guardianship paperwork through APS. CSW will continue to follow and assist.   Ian [name], MSW, LCSW-A Clinical Social Work Department [phone]

## 2015-11-10 ENCOUNTER — Encounter: Payer: Self-pay | Admitting: *Deleted

## 2015-11-10 DIAGNOSIS — R531 Weakness: Secondary | ICD-10-CM

## 2015-11-10 DIAGNOSIS — K7030 Alcoholic cirrhosis of liver without ascites: Secondary | ICD-10-CM

## 2015-11-10 DIAGNOSIS — R634 Abnormal weight loss: Secondary | ICD-10-CM

## 2015-11-10 DIAGNOSIS — D696 Thrombocytopenia, unspecified: Secondary | ICD-10-CM | POA: Insufficient documentation

## 2015-11-10 DIAGNOSIS — R110 Nausea: Secondary | ICD-10-CM

## 2015-11-10 DIAGNOSIS — R944 Abnormal results of kidney function studies: Secondary | ICD-10-CM

## 2015-11-10 DIAGNOSIS — A4151 Sepsis due to Escherichia coli [E. coli]: Principal | ICD-10-CM

## 2015-11-10 DIAGNOSIS — D649 Anemia, unspecified: Secondary | ICD-10-CM

## 2015-11-10 DIAGNOSIS — R630 Anorexia: Secondary | ICD-10-CM

## 2015-11-10 LAB — FIBRIN DEGRADATION PROD.(ARMC ONLY): Fibrin Degradation Prod.: >10 — AB (ref <10)

## 2015-11-10 LAB — GLUCOSE, CAPILLARY
Glucose-Capillary: 106 mg/dL — ABNORMAL HIGH (ref 65–99)
Glucose-Capillary: 111 mg/dL — ABNORMAL HIGH (ref 65–99)
Glucose-Capillary: 104 mg/dL — ABNORMAL HIGH (ref 65–99)
Glucose-Capillary: 114 mg/dL — ABNORMAL HIGH (ref 65–99)

## 2015-11-10 LAB — VITAMIN B12: VITAMIN B 12: 3014 pg/mL — AB (ref 180–914)

## 2015-11-10 LAB — IRON AND TIBC
Iron: 47 ug/dL (ref 45–182)
SATURATION RATIOS: 50 % — AB (ref 17.9–39.5)
TIBC: 93 ug/dL — AB (ref 250–450)
UIBC: 46 ug/dL

## 2015-11-10 LAB — FIBRIN DERIVATIVES D-DIMER (ARMC ONLY): FIBRIN DERIVATIVES D-DIMER (ARMC): 1824 — AB (ref 0–499)

## 2015-11-10 LAB — FERRITIN: FERRITIN: 5465 ng/mL — AB (ref 24–336)

## 2015-11-10 LAB — FIBRINOGEN: Fibrinogen: 348 mg/dL (ref 210–470)

## 2015-11-10 LAB — FOLATE: FOLATE: 14.3 ng/mL (ref >5.9)

## 2015-11-10 MED ORDER — DEXTROSE 5 % IV SOLN
2.0000 g | INTRAVENOUS | Status: DC
  Administered 2015-11-10 – 2015-11-12 (×3): 2 g via INTRAVENOUS
  Filled 2015-11-10 (×4): qty 2

## 2015-11-10 MED ORDER — SODIUM CHLORIDE 0.9 % IV SOLN
INTRAVENOUS | Status: DC
  Administered 2015-11-10: 19:00:00 via INTRAVENOUS

## 2015-11-10 MED ORDER — HYDRALAZINE HCL 20 MG/ML IJ SOLN
10.0000 mg | Freq: Four times a day (QID) | INTRAMUSCULAR | Status: DC | PRN
  Filled 2015-11-10: qty 1

## 2015-11-10 NOTE — Progress Notes (Signed)
Patient ID: Ian [name], male   DOB: 02-03-1951, 65 y.o.   MRN: [id number] [facility] PROGRESS NOTE  Hendrik Donath Blomgren WUJ:[id number] DOB: 08/26/1951 DOA: 11/04/2015 PCP: [name], PA  HPI/Subjective:    Patient not eating or drinking much. Patient's social situation is very complicated. His daughter who is currently in jail was supposedly bringing him do drugs. He also has other children the wrench L. They have given permission for [place] to obtain consents from his sister. Patient was seen by psychiatry and is deemed incompetent to make any decisions.  Objective: Filed Vitals:   11/10/15 0435 11/10/15 1200  BP: 103/82 115/82  Pulse: 77 82  Temp: 97.6 F (36.4 C) 97.9 F (36.6 C)  Resp: 18 18    Intake/Output Summary (Last 24 hours) at 11/10/15 1344 Last data filed at 11/10/15 1004  Gross per 24 hour  Intake      0 ml  Output      0 ml  Net      0 ml   Filed Weights   11/04/15 1734  Weight: 40.824 kg (90 lb)    ROS: Review of Systems  Unable to perform ROS  Exam: Physical Exam  Constitutional: He appears cachectic.  chronically ill-appearing HENT:  Nose: No mucosal edema.  Mouth/Throat: No oropharyngeal exudate or posterior oropharyngeal edema.  Dry, thrush  Eyes: Lids are normal. Pupils are equal, round, and reactive to light.  Jaundice  Neck: No JVD present. Carotid bruit is not present. No edema present. No thyroid mass and no thyromegaly present.  Cardiovascular: S1 normal and S2 normal.    Exam reveals no gallop.   No murmur heard. Pulses:      Dorsalis pedis pulses are 2+ on the right side, and 2+ on the left side.  Respiratory: No respiratory distress. He has no wheezes. He has no rhonchi. He has no rales.  GI: Soft. Bowel sounds are normal. There is no tenderness.  Musculoskeletal:       Right ankle: He exhibits swelling.       Left ankle: He exhibits swelling.  Lymphadenopathy:    He has no cervical adenopathy.  Neurological: Harley  drowsy  Able to move extremities  Skin: Skin is warm. Nails show no clubbing.  Psychiatric: Drowsy     Data Reviewed: Basic Metabolic Panel:  Recent Labs Lab 11/04/15 1737 11/05/15 0250 11/06/15 0419 11/07/15 0555 11/08/15 0523  NA 137 139 134* 132* 135  K 3.4* 3.4* 3.2* 4.4 4.3  CL 100* 107 104 101 103  CO2 GLUCOSE 55* 79 136* 92 98  BUN 42* 38* 31* 34* 38*  CREATININE 0.63 0.57* 0.60* 0.50* 0.42*  CALCIUM 8.4* 8.0* 7.6* 8.0* 7.9*  MG  --   --   --   --  1.4*   Liver Function Tests:  Recent Labs Lab 11/04/15 1737 11/05/15 2112 11/06/15 0419 11/07/15 0555  AST 333* 97* 78* 172*  ALT 185* 108* 96* 105*  ALKPHOS 706* 448* 445* 833*  BILITOT 6.7* 5.5* 6.2* 7.7*  PROT 6.3* 5.4* 5.3* 5.2*  ALBUMIN 2.0* 1.8* 1.7* 1.7*    Recent Labs Lab 11/04/15 1737  LIPASE <10*    Recent Labs Lab 11/04/15 1737 11/06/15 0419  AMMONIA <9* 31   CBC:  Recent Labs Lab 11/04/15 1737 11/05/15 0250 11/06/15 0419 11/07/15 0555 11/08/15 0523 11/09/15 0511  WBC 10.9* 8.3 14.3* 15.5* 14.3* 14.2*  NEUTROABS 9.2*  --   --   --   --   --  HGB 9.9* 9.1* 8.3* 8.0* 8.0* 7.6*  HCT 29.8* 27.3* 25.2* 23.7* 23.8* 23.3*  MCV 85.1 86.3 86.6 85.9 84.9 84.5  PLT 22* 17* 11* 23* 18* 17*   Cardiac Enzymes:  Recent Labs Lab 11/04/15 1737 11/04/15 2319 11/05/15 0250 11/05/15 0630 11/05/15 2112  CKTOTAL  --   --   --   --  92  TROPONINI 0.17* 0.16* 0.44* 0.73*  --     CBG:  Recent Labs Lab 11/09/15 1113 11/09/15 1816 11/10/15 0101 11/10/15 0559 11/10/15 1205  GLUCAP 186* 134* 106* 111* 104*      Studies: No results found.  Scheduled Meds: . antiseptic oral rinse  7 mL Mouth Rinse BID  . cefUROXime  500 mg Oral BID WC  . citalopram  10 mg Oral Daily  . cloNIDine  0.1 mg Transdermal Weekly  . feeding supplement (ENSURE ENLIVE)  237 mL Oral TID WC  . lipase/protease/amylase  24,000 Units Oral TID WC  . megestrol  400 mg Oral BID  . nystatin  5  mL Mouth/Throat QID  . pantoprazole sodium  40 mg Oral Daily  . phytonadione  5 mg Oral Daily  . sodium chloride flush  3 mL Intravenous Q12H   Continuous Infusions:    Assessment/Plan:  1.  Bacteremia with Escherichia coli. Source urinary, continue currently on oral anabiotic's will need total 14 days  2. Severe hypoglycemia blood sugar is staying stable  3.  Alcoholic cirrhosis, supportive care 4. Thrombocytopenia due to liver disease status post platelet transfusion platelets May need platelet transfusion 5. History of hypertension continue on clonidine patch 6. Urinary tract infection with hematuria antibiotics as above 7. Elevated troponin likely demand ischemia.  8. History of hepatitis C seen by GI supportive care recommended  9. History of neuropathy 10. Overall prognosis is very poor.  Patient not in [place] or drinking will need PEG tube placement to survive. I have discussed the case with his sister who will be signing the consent she is agreeable to that.  Code full  Code Status:     Code Status Orders        Start     Ordered   11/04/15 2157  Full code   Continuous     11/04/15 2156    Code Status History    Date Active Date Inactive Code Status Order ID Comments User Context   This patient has a current code status but no historical code status.     Family Communication: Spoke to the sister  Disposition Plan: To be determined  Consultants:  Palliative care  Antibiotics:  Rocephin  Time spent: 20 minutes.   Allena KatzPATEL, [facility]REYANG  [facility]

## 2015-11-10 NOTE — Consult Note (Signed)
Case discussed with Dr Auburn BilberryShreyang Patel.   Patietn with very low platelet count (11-17), aND poor response to platelet transfusion.  I believe the poor platelets may be related to antibiotic, since review of chart from one month ago indicated a count of  117.  I would d/c the abx, and if platelet count improves then PEG could be an option. In the interim, would temporize with dobhof tube that can be placed by radiology.

## 2015-11-10 NOTE — Progress Notes (Signed)
[place]  [phone] [phone]  ID: ELPIDIO THIELEN OB: November 26, 1950  MR#: [id number]  NOB#:[id number]  Patient Care Team: Melvyn Neth, [place] as PCP - General (Physician Assistant)  CHIEF COMPLAINT:  Chief Complaint  Patient presents with  . Weakness  . Nausea  . Hypoglycemia    INTERVAL HISTORY: Patient is a 65 year old male initially admitted to the hospital on November 04, 2015 with multiple medical complaints including weakness, fatigue, poor appetite, weight loss, and failure to thrive. Patient was found to have Escherichia coli sepsis as well as significant thrombocytopenia. Patient is lethargic and unresponsive and review of systems was not possible. Family is at bedside, but they have poor insight into the patient's overall being and health.  REVIEW OF SYSTEMS:   Review of Systems  Unable to perform ROS: critical illness    As per HPI. Otherwise, a complete review of systems is negatve.  PAST MEDICAL HISTORY: Past Medical History  Diagnosis Date  . Diabetes mellitus   . HTN (hypertension)   . Cirrhosis of liver ([place])   . Hepatitis C   . Neuropathy ([place])     PAST SURGICAL HISTORY: Past Surgical History  Procedure Laterality Date  . Mandible surgery    . Dental surgery      teeth extraction    FAMILY HISTORY History reviewed. No pertinent family history.     ADVANCED DIRECTIVES:    HEALTH MAINTENANCE: Social History  Substance Use Topics  . Smoking status: Current Every Day Smoker -- 0.50 packs/day    Types: Cigarettes  . Smokeless tobacco: Never Used  . Alcohol Use: 1.8 oz/week    3 Cans of beer per week     Colonoscopy:  PAP:  Bone density:  Lipid panel:  No Known Allergies  Current Facility-Administered Medications  Medication Dose Route Frequency Provider Last Rate Last Dose  . 0.9 %  sodium chloride infusion   Intravenous Continuous Dustin Flock, MD 75 mL/hr at 11/10/15 1831    . acetaminophen  (TYLENOL) suppository 650 mg  650 mg Rectal Q6H PRN Gladstone Lighter, MD      . antiseptic oral rinse (CPC / CETYLPYRIDINIUM CHLORIDE 0.05%) solution 7 mL  7 mL Mouth Rinse BID Loletha Grayer, MD   7 mL at 11/10/15 2152  . cefTRIAXone (ROCEPHIN) 2 g in dextrose 5 % 50 mL IVPB  2 g Intravenous Q24H Dustin Flock, MD   2 g at 11/10/15 1831  . cloNIDine (CATAPRES - Dosed in mg/24 hr) patch 0.1 mg  0.1 mg Transdermal Weekly Gladstone Lighter, MD   0.1 mg at 11/05/15 0004  . feeding supplement (ENSURE ENLIVE) (ENSURE ENLIVE) liquid 237 mL  237 mL Oral TID WC Loletha Grayer, MD   237 mL at 11/09/15 1700  . hydrALAZINE (APRESOLINE) injection 10 mg  10 mg Intravenous Q6H PRN Gladstone Lighter, MD      . morphine 2 MG/ML injection 2 mg  2 mg Intravenous Q4H PRN Gladstone Lighter, MD   2 mg at 11/07/15 1728  . ondansetron (ZOFRAN) injection 4 mg  4 mg Intravenous Q6H PRN Gladstone Lighter, MD      . sodium chloride 0.9 % bolus 1,000 mL  1,000 mL Intravenous PRN Lance Coon, MD   1,000 mL at 11/05/15 0002  . sodium chloride flush (NS) 0.9 % injection 3 mL  3 mL Intravenous Q12H Gladstone Lighter, MD   3 mL at 11/10/15 0923    OBJECTIVE: Filed Vitals:   11/10/15 1200 11/10/15  2035  BP: 115/82 118/82  Pulse: 82 78  Temp: 97.9 F (36.6 C) 97.4 F (36.3 C)  Resp: 18 18     Body mass index is 14.09 kg/(m^2).    ECOG FS:4 - Bedbound  General: Cachectic, ill-appearing, no acute distress. Eyes: Pink conjunctiva, anicteric sclera. HEENT: Normocephalic, moist mucous membranes, clear oropharnyx. Lungs: Clear to auscultation bilaterally. Heart: Regular rate and rhythm. No rubs, murmurs, or gallops. Abdomen: Soft, nontender, nondistended. No organomegaly noted, normoactive bowel sounds. Musculoskeletal: No edema, cyanosis, or clubbing. Neuro: Lethargic, unresponsive. Skin: No rashes or petechiae noted.   LAB RESULTS:  Lab Results  Component Value Date   NA 135 11/08/2015   K 4.3 11/08/2015   CL  103 11/08/2015   CO2 27 11/08/2015   GLUCOSE 98 11/08/2015   BUN 38* 11/08/2015   CREATININE 0.42* 11/08/2015   CALCIUM 7.9* 11/08/2015   PROT 5.2* 11/07/2015   ALBUMIN 1.7* 11/07/2015   AST 172* 11/07/2015   ALT 105* 11/07/2015   ALKPHOS 833* 11/07/2015   BILITOT 7.7* 11/07/2015   GFRNONAA >60 11/08/2015   GFRAA >60 11/08/2015    Lab Results  Component Value Date   WBC 14.2* 11/09/2015   NEUTROABS 9.2* 11/04/2015   HGB 7.6* 11/09/2015   HCT 23.3* 11/09/2015   MCV 84.5 11/09/2015   PLT 17* 11/09/2015     STUDIES: Ct Abdomen Pelvis W Contrast  11/04/2015  CLINICAL DATA:  Chronic abdominal pain. History of cirrhosis. Initial encounter. EXAM: CT ABDOMEN AND PELVIS WITH CONTRAST TECHNIQUE: Multidetector CT imaging of the abdomen and pelvis was performed using the standard protocol following bolus administration of intravenous contrast. CONTRAST:  75 ml OMNIPAQUE IOHEXOL 300 MG/ML  SOLN COMPARISON:  CT chest, abdomen and pelvis 06/23/2014. FINDINGS: There is no pleural or pericardial effusion. Heart size is normal. There is some mild atelectasis or scar in the left lung base. The patient is cachectic. There is diffuse body wall and mesenteric edema and some abdominal and pelvic ascites. No drainable collection is seen. A 0.6 cm nonobstructing stone is seen in the midpole of the right kidney. Contrast excretion from the right kidney is delayed and there are areas of cortical hypoattenuation on delayed imaging. No obstructing stone is seen. The urinary bladder is unremarkable. The spleen and adrenal glands appear normal. Chronic pancreatic ductal dilatation with extensive calcifications throughout the pancreas consistent with chronic pancreatitis appear unchanged. There is a small low attenuating lesion in the left hepatic lobe on image 12 is unchanged. The liver is otherwise unremarkable. The gallbladder appears normal. Small bowel loops are largely decompressed but otherwise unremarkable. The  colon and stomach appear normal. The appendix is not visualized but no focal inflammatory process is seen. Aortoiliac atherosclerosis without aneurysm is identified. No focal bony abnormality is seen. IMPRESSION: Cachectic appearing patient with diffuse body wall and mesenteric edema consistent with anasarca. Abnormal enhancement pattern right kidney is most worrisome for pyelonephritis. Nonobstructing stone midpole right kidney. Chronic calcific pancreatitis. Atherosclerosis. Electronically Signed   By: Inge Rise M.D.   On: 11/04/2015 20:35    ASSESSMENT: Severe thrombocytopenia in the setting of bacteremia, alcoholic liver cirrhosis, and multiple medical problems.  PLAN:    1. Thrombocytopenia: Likely multifactorial. Patient's platelet count was greater than 100 approximately one month ago. Suspect his acute drop in platelet count is secondary to his Escherichia coli sepsis, medications, poor bone marrow reserve, severe alcoholic cirrhosis and portal nutritional status. Patient does not appear to have DIC nor evidence of peripheral  platelet destruction. By report, patient had poor response to platelet transfusion. No intervention is needed at this time. Patient does not require bone marrow biopsy. Once Escherichia coli is cleared and antibiotics discontinued, can consider prednisone burst of 1 mg/kg for one week to see if there is any improvement.  2. Elevated liver enzymes: Secondary to underlying cirrhosis. 3. Anemia: Also likely multifactorial and poor bone marrow reserve. Patient's iron stores, B 12, folate are all within normal limits. He has no evidence of bleeding. There is no evidence of hemolysis. Continue to monitor and transfuse if patient's hemoglobin falls below 7.0. 4. Poor nutritional status: Given patient's decreased platelet count, a PEG tube is not recommended at this time. Even if platelet count improves, it is unclear the benefit of tube feeds given patient's poor prognosis and  overall health. 5. Disposition: Patient has an extremely poor prognosis and recommended palliative care consult and consideration of hospice care.  Will follow from a distance. Call for any further questions.   Lloyd Huger, MD   11/10/2015 10:59 PM

## 2015-11-10 NOTE — Progress Notes (Signed)
Nutrition Follow-up  DOCUMENTATION CODES:   Severe malnutrition in context of chronic illness  INTERVENTION:   Coordination of Care: follow poc, GI has been consulted for PEG placement, continue to assess Meals/Snacks: encourage po intake as tolerated Medical Food Supplement Therapy: continue Passenger transport managernsureEnlive and Magic Cup supplement   NUTRITION DIAGNOSIS:   Malnutrition related to chronic illness as evidenced by severe depletion of body fat, severe depletion of muscle mass.  GOAL:   Patient will meet greater than or equal to 90% of their needs  MONITOR:   PO intake, Supplement acceptance, Weight trends, Labs, Diet advancement  REASON FOR ASSESSMENT:   Consult Assessment of nutrition requirement/status  ASSESSMENT:    Pt not eating or drinking well per MD notes, GI consulted for PEG placement, GI has been following, pt cirrhosis and ongoing EtOH abuse but also with hx of chronic pancreatitis and chronic malabsorption, pt remains full code at present  Diet Order:  DIET - DYS 1 Room service appropriate?: Yes with Assist; Fluid consistency:: Thin   Energy Intake: per report, pt is not eating well, limited documentation of po intake, 100% of meal tray on 3/9, other po intake bites only, reporting of pt not eating well. Per Elnita Maxwellheryl RN, pt did not appear safe for po intake this AM, pt did not tolerate meds by mouth very well this AM  Skin:  Reviewed, no issues   Recent Labs Lab 11/06/15 0419 11/07/15 0555 11/08/15 0523  NA 134* 132* 135  K 3.2* 4.4 4.3  CL 104 101 103  CO2 24 25 27   BUN 31* 34* 38*  CREATININE 0.60* 0.50* 0.42*  CALCIUM 7.6* 8.0* 7.9*  MG  --   --  1.4*  GLUCOSE 136* 92 98    Glucose Profile:  Recent Labs  11/10/15 0101 11/10/15 0559 11/10/15 1205  GLUCAP 106* 111* 104*   Nutritional Anemia Profile:  CBC Latest Ref Rng 11/09/2015 11/08/2015 11/07/2015  WBC 3.8 - 10.6 K/uL 14.2(H) 14.3(H) 15.5(H)  Hemoglobin 13.0 - 18.0 g/dL 7.6(L) 8.0(L) 8.0(L)   Hematocrit 40.0 - 52.0 % 23.3(L) 23.8(L) 23.7(L)  Platelets 150 - 440 K/uL 17(LL) 18(LL) 23(LL)   Meds: megace, creon  Height:   Ht Readings from Last 1 Encounters:  11/04/15 5\' 7"  (1.702 m)    Weight:   Wt Readings from Last 1 Encounters:  11/04/15 90 lb (40.824 kg)    Filed Weights   11/04/15 1734  Weight: 90 lb (40.824 kg)    BMI:  Body mass index is 14.09 kg/(m^2).  Estimated Nutritional Needs:   Kcal:  1675-2010 kcals  using IBW  67 kg  Protein:  80-94 g (1.2-1.4 g/kg)   Fluid:  1675-2010 mL   HIGH Care Level  Romelle Starcherate Parrie Rasco MS, RD, LDN [phone] Pager  [phone] Weekend/On-Call Pager

## 2015-11-10 NOTE — Progress Notes (Signed)
Patient is not eating or driking, d/w sister [place] Ijames, she agreeable for patient to get peg tube.  We will consult gi.

## 2015-11-10 NOTE — Progress Notes (Signed)
PT Cancellation Note  Patient Details Name: Ian [name] MRN: [id number] DOB: 1951/03/17   Cancelled Treatment:    Reason Eval/Treat Not Completed: Patient's level of consciousness. Chart reviewed and attempted to see patient. Pt not responding to verbal commands or interacting with therapist. Sister in room and states that pt has not responded to staff today. Will attempt PT treatment on later date/time. Currently pt is not appropriate for SNF as he is not participating with therapy.  Sharalyn InkJason D Marchelle Rinella PT, DPT   Lollie Gunner 11/10/2015, 5:09 PM

## 2015-11-10 NOTE — Progress Notes (Signed)
Received call from Dr. Ricki RodriguezSkulski states due to patient having low platelet count he not candiate for peg tube palcement, recommends duboff tube placement, this patient pulled his IV out he will not keep duboff tube.

## 2015-11-10 NOTE — Progress Notes (Signed)
Ian [name] [phone] sister

## 2015-11-10 NOTE — Care Management (Signed)
There is discussion of PEG but platelets are low.  GI has stated low platelets could be due to the antibiotics which are being used to treat E Coli bacteremia.   Discussed with attending that patient may benefit from palliative care consult.  Patient is full code and condition/prognosis  very guarded.  Unable to proceed with PEG due to low platelets.  Have considered dobb hoff but it is thought patient may pull it out.

## 2015-11-10 NOTE — Progress Notes (Signed)
Spoke w/ MD Auburn BilberryShreyang Patel re cannot give patient anything by mouth, including medications, and intake remains extremely poor, pt is not a candidate for PEG d/t platelet count per GI and Onc, MD declines to place NG tube d/t likelihood pt will pull it out.  Stop all PO meds and start fluids and IV rocephin, no other orders at this time.

## 2015-11-10 NOTE — Plan of Care (Signed)
Problem: Education: Goal: Knowledge of [facility] Education information/materials will improve Outcome: Not Progressing Pt lethargic, not very responsive, mostly non verbal  Problem: Health Behavior/Discharge Planning: Goal: Ability to manage health-related needs will improve Outcome: Not Progressing Pt is unable to meet this goal at this time; dx of dementia

## 2015-11-10 NOTE — Progress Notes (Signed)
Per MD Allena KatzPatel, ok to DC PICC order for now, this RN placed PIV in patient's right foot

## 2015-11-11 LAB — GLUCOSE, CAPILLARY
GLUCOSE-CAPILLARY: 285 mg/dL — AB (ref 65–99)
GLUCOSE-CAPILLARY: 355 mg/dL — AB (ref 65–99)
GLUCOSE-CAPILLARY: 60 mg/dL — AB (ref 65–99)
GLUCOSE-CAPILLARY: 79 mg/dL (ref 65–99)
Glucose-Capillary: 40 mg/dL — CL (ref 65–99)
Glucose-Capillary: 50 mg/dL — ABNORMAL LOW (ref 65–99)
Glucose-Capillary: 201 mg/dL — ABNORMAL HIGH (ref 65–99)

## 2015-11-11 LAB — CBC
HEMATOCRIT: 21.7 % — AB (ref 40.0–52.0)
Hemoglobin: 7.4 g/dL — ABNORMAL LOW (ref 13.0–18.0)
MCH: 28.8 pg (ref 26.0–34.0)
MCHC: 33.9 g/dL (ref 32.0–36.0)
MCV: 84.8 fL (ref 80.0–100.0)
PLATELETS: 15 10*3/uL — AB (ref 150–440)
RBC: 2.56 MIL/uL — ABNORMAL LOW (ref 4.40–5.90)
RDW: 15 % — AB (ref 11.5–14.5)
WBC: 10.8 10*3/uL — AB (ref 3.8–10.6)

## 2015-11-11 LAB — CORTISOL: Cortisol, Plasma: 22.6 ug/dL

## 2015-11-11 MED ORDER — PREDNISONE 20 MG PO TABS
50.0000 mg | ORAL_TABLET | Freq: Every day | ORAL | Status: DC
  Administered 2015-11-11 – 2015-11-13 (×3): 50 mg via ORAL
  Filled 2015-11-11: qty 2
  Filled 2015-11-11: qty 1
  Filled 2015-11-11: qty 2

## 2015-11-11 MED ORDER — DEXTROSE-NACL 5-0.9 % IV SOLN
INTRAVENOUS | Status: DC
  Administered 2015-11-11 (×2): via INTRAVENOUS

## 2015-11-11 MED ORDER — SODIUM CHLORIDE 0.45 % IV SOLN
INTRAVENOUS | Status: DC
  Administered 2015-11-11: 18:00:00 via INTRAVENOUS

## 2015-11-11 MED ORDER — DEXTROSE 50 % IV SOLN
INTRAVENOUS | Status: AC
  Administered 2015-11-11: 50 mL
  Filled 2015-11-11: qty 50

## 2015-11-11 NOTE — Progress Notes (Signed)
Pt transferred to RM 124 on 1C, [facility], pt has been much more alert today and taking POs a little at a time,  His eyes have been open most of the day.  Did take his PO prednisone earlier crushed in applesauce with no issues, drinking Ensure very well, max assist, inc B&B. IV site in infusing well.

## 2015-11-11 NOTE — Progress Notes (Signed)
Patient ID: Ian [name], male   DOB: [birth year], 65 y.o.   MRN: [id number] [facility] PROGRESS NOTE  Joette CatchingBobby L [place] MWU:[id number] DOB: [birth year] DOA: 11/04/2015 PCP: Rico JunkerHANNE, [name], PA  HPI/Subjective:   Patient's blood sugars drop last night., He had to receive amp of D50. This morning is more awake. And trying to eat. Still confused  Objective: Filed Vitals:   11/10/15 2035 11/11/15 0406  BP: 118/82 135/94  Pulse: 78 74  Temp: 97.4 F (36.3 C)   Resp: 18 21    Intake/Output Summary (Last 24 hours) at 11/11/15 0855 Last data filed at 11/11/15 [id number]  Gross per 24 hour  Intake 156.25 ml  Output      3 ml  Net 153.25 ml   Filed Weights   11/04/15 1734  Weight: 40.824 kg (90 lb)    ROS: Review of Systems  Unable to perform ROS  Exam: Physical Exam  Constitutional: He appears cachectic.  chronically ill-appearing HENT:  Nose: No mucosal edema.  Mouth/Throat: No oropharyngeal exudate or posterior oropharyngeal edema.  Dry, thrush  Eyes: Lids are normal. Pupils are equal, round, and reactive to light.  Jaundice  Neck: No JVD present. Carotid bruit is not present. No edema present. No thyroid mass and no thyromegaly present.  Cardiovascular: S1 normal and S2 normal.    Exam reveals no gallop.   No murmur heard. Pulses:      Dorsalis pedis pulses are 2+ on the right side, and 2+ on the left side.  Respiratory: No respiratory distress. He has no wheezes. He has no rhonchi. He has no rales.  GI: Soft. Bowel sounds are normal. There is no tenderness.  Musculoskeletal:       Right ankle: He exhibits swelling.       Left ankle: He exhibits swelling.  Lymphadenopathy:    He has no cervical adenopathy.  Neurological: Awake and moving all 4 extremities  Able to move extremities  Skin: Skin is warm. Nails show no clubbing.  Psychiatric: Not anxious or depressed     Data Reviewed: Basic Metabolic Panel:  Recent Labs Lab 11/04/15 1737  11/05/15 0250 11/06/15 0419 11/07/15 0555 11/08/15 0523  NA 137 139 134* 132* 135  K 3.4* 3.4* 3.2* 4.4 4.3  CL 100* 107 104 101 103  CO2 29 25 24 25 27   GLUCOSE 55* 79 136* 92 98  BUN 42* 38* 31* 34* 38*  CREATININE 0.63 0.57* 0.60* 0.50* 0.42*  CALCIUM 8.4* 8.0* 7.6* 8.0* 7.9*  MG  --   --   --   --  1.4*   Liver Function Tests:  Recent Labs Lab 11/04/15 1737 11/05/15 2112 11/06/15 0419 11/07/15 0555  AST 333* 97* 78* 172*  ALT 185* 108* 96* 105*  ALKPHOS 706* 448* 445* 833*  BILITOT 6.7* 5.5* 6.2* 7.7*  PROT 6.3* 5.4* 5.3* 5.2*  ALBUMIN 2.0* 1.8* 1.7* 1.7*    Recent Labs Lab 11/04/15 1737  LIPASE <10*    Recent Labs Lab 11/04/15 1737 11/06/15 0419  AMMONIA <9* 31   CBC:  Recent Labs Lab 11/04/15 1737  11/06/15 0419 11/07/15 0555 11/08/15 0523 11/09/15 0511 11/11/15 0400  WBC 10.9*  < > 14.3* 15.5* 14.3* 14.2* 10.8*  NEUTROABS 9.2*  --   --   --   --   --   --   HGB 9.9*  < > 8.3* 8.0* 8.0* 7.6* 7.4*  HCT 29.8*  < > 25.2* 23.7* 23.8* 23.3* 21.7*  MCV  85.1  < > 86.6 85.9 84.9 84.5 84.8  PLT 22*  < > 11* 23* 18* 17* 15*  < > = values in this interval not displayed. Cardiac Enzymes:  Recent Labs Lab 11/04/15 1737 11/04/15 2319 11/05/15 0250 11/05/15 0630 11/05/15 2112  CKTOTAL  --   --   --   --  92  TROPONINI 0.17* 0.16* 0.44* 0.73*  --     CBG:  Recent Labs Lab 11/11/15 0040 11/11/15 0613 11/11/15 0627 11/11/15 0636 11/11/15 0707  GLUCAP 79 40* 50* 60* 201*      Studies: No results found.  Scheduled Meds: . antiseptic oral rinse  7 mL Mouth Rinse BID  . cefTRIAXone (ROCEPHIN)  IV  2 g Intravenous Q24H  . cloNIDine  0.1 mg Transdermal Weekly  . feeding supplement (ENSURE ENLIVE)  237 mL Oral TID WC  . predniSONE  50 mg Oral Q breakfast  . sodium chloride flush  3 mL Intravenous Q12H   Continuous Infusions: . dextrose 5 % and 0.9% NaCl 100 mL/hr at 11/11/15 0650    Assessment/Plan:  1.  Bacteremia with Escherichia  coli. Source urinary, Back on Rocephin due to unable to take oral medications I will continue this for now  2. Severe hypoglycemia blood sugar due to poor liver function for oral by mouth intake. I will check a random cortisol level. 3.  Alcoholic cirrhosis, supportive care prognosis poor 4. Thrombocytopenia due to liver disease status post platelet transfusion, his platelets continue to drop slowly was seen by hematology yesterday they felt that this is multifactorial but did recommend possible prednisone therapy. 5. History of hypertension continue on clonidine patch blood pressure stable 6. Urinary tract infection with hematuria antibiotics as above 7. Elevated troponin likely demand ischemia.  8. History of hepatitis C seen by GI supportive care recommended  9. History of neuropathy 10. Overall prognosis is very poor. Due to poor by mouth intake. I discussed the case with GI Dr. Louann Sjogren who states that with his platelet count being low he is not a good candidate for PEG tube placement. He is more awake today hopefully continues to eat and meet his calorie intake.  Code full  Code Status:     Code Status Orders        Start     Ordered   11/04/15 2157  Full code   Continuous     11/04/15 2156    Code Status History    Date Active Date Inactive Code Status Order ID Comments User Context   This patient has a current code status but no historical code status.     Family Communication: Spoke to the sister  Disposition Plan: To be determined  Consultants:  Palliative care  Antibiotics:  Rocephin  Time spent: 20 minutes.   Allena Katz [facility]  [facility]

## 2015-11-11 NOTE — Progress Notes (Signed)
This nurse spoke with Dr. Allena KatzPatel about pt's CBG of 355.  New orders for 0.45 NS.  Orson Apeanielle Kora Groom, RN

## 2015-11-12 DIAGNOSIS — E872 Acidosis: Secondary | ICD-10-CM

## 2015-11-12 DIAGNOSIS — G9341 Metabolic encephalopathy: Secondary | ICD-10-CM

## 2015-11-12 DIAGNOSIS — R188 Other ascites: Secondary | ICD-10-CM

## 2015-11-12 LAB — BASIC METABOLIC PANEL
Anion gap: 7 (ref 5–15)
BUN: 47 mg/dL — ABNORMAL HIGH (ref 6–20)
CHLORIDE: 106 mmol/L (ref 101–111)
CO2: 23 mmol/L (ref 22–32)
Calcium: 7.4 mg/dL — ABNORMAL LOW (ref 8.9–10.3)
Creatinine, Ser: 0.48 mg/dL — ABNORMAL LOW (ref 0.61–1.24)
GFR calc non Af Amer: >60 mL/min (ref >60)
Glucose, Bld: 288 mg/dL — ABNORMAL HIGH (ref 65–99)
POTASSIUM: 4 mmol/L (ref 3.5–5.1)
SODIUM: 136 mmol/L (ref 135–145)

## 2015-11-12 LAB — CBC
HEMATOCRIT: 21 % — AB (ref 40.0–52.0)
HEMOGLOBIN: 6.9 g/dL — AB (ref 13.0–18.0)
MCH: 28.2 pg (ref 26.0–34.0)
MCHC: 32.9 g/dL (ref 32.0–36.0)
MCV: 85.7 fL (ref 80.0–100.0)
Platelets: 13 10*3/uL — CL (ref 150–440)
RBC: 2.45 MIL/uL — AB (ref 4.40–5.90)
RDW: 15.6 % — ABNORMAL HIGH (ref 11.5–14.5)
WBC: 11.3 10*3/uL — ABNORMAL HIGH (ref 3.8–10.6)

## 2015-11-12 LAB — PREPARE RBC (CROSSMATCH)

## 2015-11-12 LAB — GLUCOSE, CAPILLARY
GLUCOSE-CAPILLARY: 153 mg/dL — AB (ref 65–99)
GLUCOSE-CAPILLARY: 219 mg/dL — AB (ref 65–99)
GLUCOSE-CAPILLARY: 404 mg/dL — AB (ref 65–99)
GLUCOSE-CAPILLARY: 418 mg/dL — AB (ref 65–99)
GLUCOSE-CAPILLARY: 430 mg/dL — AB (ref 65–99)
GLUCOSE-CAPILLARY: 448 mg/dL — AB (ref 65–99)
Glucose-Capillary: 374 mg/dL — ABNORMAL HIGH (ref 65–99)
Glucose-Capillary: 293 mg/dL — ABNORMAL HIGH (ref 65–99)
Glucose-Capillary: 186 mg/dL — ABNORMAL HIGH (ref 65–99)

## 2015-11-12 MED ORDER — SODIUM CHLORIDE 0.9 % IV SOLN
Freq: Once | INTRAVENOUS | Status: AC
  Administered 2015-11-12: 16:00:00 via INTRAVENOUS

## 2015-11-12 MED ORDER — INSULIN ASPART 100 UNIT/ML ~~LOC~~ SOLN
0.0000 [IU] | Freq: Four times a day (QID) | SUBCUTANEOUS | Status: DC
  Administered 2015-11-12: 8 [IU] via SUBCUTANEOUS
  Administered 2015-11-12: 23:00:00 2 [IU] via SUBCUTANEOUS
  Filled 2015-11-12: qty 8
  Filled 2015-11-12: qty 2

## 2015-11-12 MED ORDER — INSULIN ASPART 100 UNIT/ML ~~LOC~~ SOLN
10.0000 [IU] | Freq: Once | SUBCUTANEOUS | Status: AC
Start: 2015-11-12 — End: 2015-11-12
  Administered 2015-11-12: 10 [IU] via SUBCUTANEOUS
  Filled 2015-11-12: qty 10

## 2015-11-12 MED ORDER — INSULIN ASPART 100 UNIT/ML ~~LOC~~ SOLN
10.0000 [IU] | Freq: Once | SUBCUTANEOUS | Status: AC
  Administered 2015-11-12: 10 [IU] via SUBCUTANEOUS
  Filled 2015-11-12: qty 10

## 2015-11-12 MED ORDER — INSULIN ASPART 100 UNIT/ML ~~LOC~~ SOLN: 0.0000 [IU] | SUBCUTANEOUS | Status: DC

## 2015-11-12 NOTE — Plan of Care (Signed)
Problem: Education: Goal: Knowledge of [facility] Education information/materials will improve Outcome: Not Progressing pts family at bedside and educated  Problem: Pain Managment: Goal: General experience of comfort will improve Outcome: Progressing Sleeps after morphine. Resting quietly  Problem: Skin Integrity: Goal: Risk for impaired skin integrity will decrease Outcome: Not Progressing eccy and  emaciated  Problem: Tissue Perfusion: Goal: Risk factors for ineffective tissue perfusion will decrease Outcome: Not Progressing hgb 6.9 received 1 unit prbcs today  Problem: Activity: Goal: Risk for activity intolerance will decrease Outcome: Not Progressing bedrest  Problem: Nutrition: Goal: Adequate nutrition will be maintained Outcome: Progressing Eating and drinking. Drinks  More than he eats

## 2015-11-12 NOTE — Progress Notes (Signed)
Inpatient Diabetes Program Recommendations  AACE/ADA: New Consensus Statement on Inpatient Glycemic Control (2015)  Target Ranges:  Prepandial:   less than 140 mg/dL      Peak postprandial:   less than 180 mg/dL (1-2 hours)      Critically ill patients:  140 - 180 mg/dL   Results for Ian [name], Ian [name] (MRN [id number]) as of 11/12/2015 10:50  Ref. Range 11/11/2015 00:40 11/11/2015 06:13 11/11/2015 06:27 11/11/2015 06:36 11/11/2015 07:07 11/11/2015 11:48 11/11/2015 17:35  Glucose-Capillary Latest Ref Range: 65-99 mg/dL 79 40 (LL) 50 ([name]) 60 ([name]) 201 (H) 285 (H) 355 (H)   Results for Ian [name], Ian [name] (MRN [id number]) as of 11/12/2015 10:50  Ref. Range 11/12/2015 00:23 11/12/2015 00:23 11/12/2015 02:01 11/12/2015 02:12 11/12/2015 03:37 11/12/2015 06:11  Glucose-Capillary Latest Ref Range: 65-99 mg/dL 295418 (H) 284448 (H) 132430 (H) 404 (H) 374 (H) 293 (H)     Review of Glycemic Control  Diabetes history: DM2  Home DM meds: Unknown  Current Orders for Inpatient glycemic control: Orders for CBG checks Q6 hours     MD- Note patient now getting Prednisone.  CBGs are now extremely elevated.  Patient received 10 units Novolog at midnight and another 10 units Novolog at 2am for glucose levels >400 mg/dl.  If within goals of care for this patient, please consider starting Novolog Sensitive Correction Scale/ SSI (0-9 units) Q4 hours (per Glycemic Control Order set)      --Will follow patient during hospitalization--  Ambrose FinlandJeannine Johnston Lakia Gritton RN, MSN, CDE Diabetes Coordinator Inpatient Glycemic Control Team Team Pager: [phone] (8a-5p)

## 2015-11-12 NOTE — Progress Notes (Signed)
Speech Language Pathology Treatment: Dysphagia  Patient Details Name: Ian [name] MRN: [id number] DOB: 02/27/1951 Today's Date: 11/12/2015 Time: 0820-0900 SLP Time Calculation (min) (ACUTE ONLY): 40 min  Assessment / Plan / Recommendation Clinical Impression  Pt appeared to adequately tolerate trials of po's of puree and thin liquids via straw; tends to take consecutive sips when drinking and education w/ monitoring given on slowing down taking single sips vs multiple ones to lessen risk for aspiration. Pt tended to exhibit increased oral phase time w/ the purees; requires monitoring by the feeder to allow time for pt to clear appropriately. Rec. Alternating foods/liquids as well to aid clearing. Rec. Continue w/ current diet of Puree and thin liquids w/ aspiration precautions and meds in Puree. Pt and family/caregivers would benefit from ST services for further education on diet consistency and precautions/strategies to help ensure safety w/ po intake while admitted.   HPI HPI: Pt is a 65 y.o. male with a known history of Dementia, jaundice, protein malnutrition, diabetes mellitus, hypertension, Hep C, alcoholic liver cirrhosis, chronic peripheral neuropathy sense from home secondary to poor by mouth intake and failure to thrive. He appears very disheveled and hasn't been well taken care of by his appearance. Daughter was explained how sick the patient was but she says that she cannot care for him at home and he will need placement. He appears jaundiced and smells of urine. He hasn't been eating for the past few weeks at home. Occasional drinking beer continuous. Labs indicate jaundice, also thrombocytopenia and dehydration. Currently, he has been tolerating a puree liquid diet w/ thin liquids per NSG report. Pt eat bites/sips then sometimes more at meals per NA report. Pt does remain uncomfortable at times stating discomfort of legs; NSG aware.         SLP Plan  Continue with current plan of  care     Recommendations  Diet recommendations: Dysphagia 1 (puree);Thin liquid Liquids provided via: Cup;Straw Medication Administration: Whole meds with puree Supervision: Full supervision/cueing for compensatory strategies;Trained caregiver to feed patient Compensations: Minimize environmental distractions;Slow rate;Small sips/bites;Follow solids with liquid Postural Changes and/or Swallow Maneuvers: Seated upright 90 degrees;Upright 30-60 min after meal             General recommendations:  (Dietician following) Oral Care Recommendations: Oral care BID;Staff/trained caregiver to provide oral care Follow up Recommendations: None Plan: Continue with current plan of care     GO               Jerilynn SomKatherine Watson, MS, CCC-SLP  Watson,Katherine 11/12/2015, 2:52 PM

## 2015-11-12 NOTE — Progress Notes (Signed)
Patient ID: JAMION CARTER, male   DOB: [birth year], 65 y.o.   MRN: [id number] [facility] PROGRESS NOTE  Dorris Vangorder [place] NWG:[id number] DOB: [birth year] DOA: 11/04/2015 PCP: [name], PA  HPI/Subjective:  Patient hemoglobin is dropping. Platelet counts are dropping. His sugars are now elevated     Objective: Filed Vitals:   11/12/15 0431 11/12/15 0918  BP: 114/76 129/93  Pulse: 81 90  Temp: 97.7 F (36.5 C)   Resp: 18 16   No intake or output data in the 24 hours ending 11/12/15 1205 Filed Weights   11/04/15 1734  Weight: 40.824 kg (90 lb)    ROS: Review of Systems  Unable to perform ROS  Exam: Physical Exam  Constitutional: He appears cachectic.  chronically ill-appearing HENT:  Nose: No mucosal edema.  Mouth/Throat: No oropharyngeal exudate or posterior oropharyngeal edema.  Dry, thrush  Eyes: Lids are normal. Pupils are equal, round, and reactive to light.  Jaundice  Neck: No JVD present. Carotid bruit is not present. No edema present. No thyroid mass and no thyromegaly present.  Cardiovascular: S1 normal and S2 normal.    Exam reveals no gallop.   No murmur heard. Pulses:      Dorsalis pedis pulses are 2+ on the right side, and 2+ on the left side.  Respiratory: No respiratory distress. He has no wheezes. He has no rhonchi. He has no rales.  GI: Soft. Bowel sounds are normal. There is no tenderness.  Musculoskeletal:       Right ankle: He exhibits swelling.       Left ankle: He exhibits swelling.  Lymphadenopathy:    He has no cervical adenopathy.  Neurological: Awake and moving all 4 extremities  Able to move extremities  Skin: Skin is warm. Nails show no clubbing.  Psychiatric: Not anxious or depressed     Data Reviewed: Basic Metabolic Panel:  Recent Labs Lab 11/06/15 0419 11/07/15 0555 11/08/15 0523 11/12/15 0505  NA 134* 132* 135 136  K 3.2* 4.4 4.3 4.0  CL 104 101 103 106  CO2 GLUCOSE 136* 92 98 288*   BUN 31* 34* 38* 47*  CREATININE 0.60* 0.50* 0.42* 0.48*  CALCIUM 7.6* 8.0* 7.9* 7.4*  MG  --   --  1.4*  --    Liver Function Tests:  Recent Labs Lab 11/05/15 2112 11/06/15 0419 11/07/15 0555  AST 97* 78* 172*  ALT 108* 96* 105*  ALKPHOS 448* 445* 833*  BILITOT 5.5* 6.2* 7.7*  PROT 5.4* 5.3* 5.2*  ALBUMIN 1.8* 1.7* 1.7*   No results for input(s): LIPASE, AMYLASE in the last 168 hours.  Recent Labs Lab 11/06/15 0419  AMMONIA 31   CBC:  Recent Labs Lab 11/07/15 0555 11/08/15 0523 11/09/15 0511 11/11/15 0400 11/12/15 0505  WBC 15.5* 14.3* 14.2* 10.8* 11.3*  HGB 8.0* 8.0* 7.6* 7.4* 6.9*  HCT 23.7* 23.8* 23.3* 21.7* 21.0*  MCV 85.9 84.9 84.5 84.8 85.7  PLT 23* 18* 17* 15* 13*   Cardiac Enzymes:  Recent Labs Lab 11/05/15 2112  CKTOTAL 92    CBG:  Recent Labs Lab 11/12/15 0023 11/12/15 0201 11/12/15 0212 11/12/15 0337 11/12/15 0611  GLUCAP 448*  418* 430* 404* 374* 293*      Studies: No results found.  Scheduled Meds: . sodium chloride   Intravenous Once  . antiseptic oral rinse  7 mL Mouth Rinse BID  . cefTRIAXone (ROCEPHIN)  IV  2 g Intravenous Q24H  . cloNIDine  0.1 mg Transdermal Weekly  . feeding supplement (ENSURE ENLIVE)  237 mL Oral TID WC  . predniSONE  50 mg Oral Q breakfast  . sodium chloride flush  3 mL Intravenous Q12H   Continuous Infusions:    Assessment/Plan:  1.  Bacteremia with Escherichia coli. Source urinary, continue ceftriaxone due to unpredictable oral intake  2. Severe hypoglycemia blood sugar due to poor liver function for oral by mouth intake. Now his sugars are elevated with patient being on prednisone 3.  Alcoholic cirrhosis, supportive care prognosis poor 4. Thrombocytopenia due to liver disease status post platelet transfusion,  Platelet count continues to drop no evidence of bleeding. Continue prednisone for now 5. History of hypertension continue on clonidine patch blood pressure stable 6. Urinary  tract infection with hematuria antibiotics as above 7. Elevated troponin likely demand ischemia.  8. History of hepatitis C seen by GI supportive care recommended  9. History of neuropathy 10. Anemia with drop in a patient's hemoglobin. I have discussed with his sister she is agreeable to patient receiving transfusion risk and benefits explained agreeable Code full  Code Status:     Code Status Orders        Start     Ordered   11/04/15 2157  Full code   Continuous     11/04/15 2156    Code Status History    Date Active Date Inactive Code Status Order ID Comments User Context   This patient has a current code status but no historical code status.     Family Communication: Spoke to the sister  Disposition Plan: To be determined  Consultants:  Palliative care  Antibiotics:  Rocephin  Time spent: 20 minutes.   Allena KatzPATEL, [facility]olla[facility]REYANG  [facility]

## 2015-11-12 NOTE — Plan of Care (Signed)
Problem: SLP Dysphagia Goals Goal: Misc Dysphagia Goal Pt will safely tolerate po diet of least restrictive consistency w/ no overt s/s of aspiration noted by Staff/pt/family x3 sessions.    

## 2015-11-12 NOTE — Progress Notes (Addendum)
Palliative Medicine Inpatient Consult Follow Up Note   Name: Ian [name] Date: 11/12/2015 MRN: [id number]  DOB: 01-06-1951  Referring Physician: Auburn Bilberry, MD  Palliative Care consult requested for this 65 y.o. male for goals of medical therapy in patient with severe malnutrition and failure to thrive.     UPDATE: I had performed an initial consult on 11/05/15 and had extensive conversations with Dr. Marva Panda and all on his care team at that time.  Pt apparently has some kind of malabsorption difficulty based on my talk with Dr. Marva Panda.  And, he is profoundly malnourished. So much so that he would be appropriate for Hospice Home --since he also does not currently eat or drink enough to stay alive.  Any number of his medical conditions would make him eligible for end of life care.  I had wanted to try to call the appropriate relative but at that time, there was still confusion about who was the decision maker.  It had been reported that his daughter was in [place] and attempts to reach her through the jail were not successful at first.  Child psychotherapist notes give some details as to the complexity of the family situations--with many relatives (sons and daughter) incarcerated.   Dr Hilton Sinclair then spoke with pt's daughter and she wanted 'everything done' for pt.  He was confirmed to be FULL CODE status.  At that time, I signed off as there was not much need for me to clarify wishes that Dr. Hilton Sinclair had clarified.   Since then this daughter is the default contact and has been approving of platelet and blood transfusions. Pt had a bed off at a SNF, but has not been eating or drinking well enough to survive, so a PEG tube was felt to be needed. Unfortunately, he cannot have this placed with the continually low platelet counts etc.  Dr Marva Panda thought that perhaps the antibiotics were a factor in keeping platelets low and recommended NG feedings until his ecoli infection (bacteremia) can be fully  treated. But, pt has been a bit more alert recently and instead he has a pureed diet with thin liquids. He tends to drink his ENSURE -- And he ate a few bites for me --BUT the oral phase of swallowing even the pureed soup was profoundly delayed when I fed him just 3 spoonfuils.   He had continued being hypoglycemic but now has become hyperglycemic.   A Hematology Consult from Dr Orlie Dakin prompted a re-consult by me as he recommended a palliative/ comfort approach to care.  Unfortunately, it is likely that I will get the same answer from pts relatives as Dr. Hilton Sinclair obtained.    I have concerns about the clarity related to decision-maker for pt.  Legally, it should be 'the majority of reachable/ available adult children".  Records show that one son felt he should be the decider 'because he is the oldest'--but that is not how it works.  I will talk tomorrow with social work/ care mgmt about family dynamics and see who I can/ should talk with.    He has incurable terminal conditions.  His life expectancy is less than six months.  He could bleed to death at any moment.  His condition is not fixable.  It can be managed with temporizing measures such as transfusions but his poor response to transfusion of platelets and his MELD score and a number of other indicators mean that he has a very grim likelihood of survival--- no matter what we  do.  He does drink his ENSURE and this should be encouraged.  He might not be able to tolerate the pureed foods as it seems to take forever just to get one teaspoon down his throat.    IMPRESSION: Sepsis due to pyelonephritis --Blood cultures pos for e coli/ GNR in aerobic and anaerobic cxs Acute Pyelonephritis Cirrhosis--alcoholic --with ascites --MELD score 21 and CTP C giving him limited life expectancy --Total bilirubin 6.7 on 3/8 --with coagulopathy (INR 1.9) Severe Thrombocytopenia--persistent  --platelets have been down to 17 (no bleeding as yet) Kidney  Stone Atelectasis Chronic Calcific Pancreatitis Severe Hypoglycemia  --Glucose less than 10 today requiring 2 amps of D50  --probably related to depleted glycogen stores, liver dz, and starvation Severe malnutrition and cachexia --related to substance/ alcohol abuse with very poor oral intake of food Hepatitis C ---[facility] [place] but not known if he went there Cocaine Use  --UDS was positive for cocaine at admission (along with cannabinoid) Severe Malnutrition due to substance abuse and limited intake  ---BMI is 14 Possible dysphagia (trouble swallowing solids--SLP to follow) Metabolic Encephalopathy Chronic Pain and chronic Peripheral Neuropathy Elevated Troponins --due to demand ischemia Lactic Acidosis Malignant HTN at admission (BP 203/ 119) Nausea and Vomiting Alcoholism (though alcohol in blood was low at adm --reports he drinks 3 cans beer / week Diverticulosis and Hemorrhois seen on colonoscopy in 2015 H/O Pancreatic dust stones --[facility] DM[facility] ---seen in ED on 2/2 with glucose 399 and c/o neuropathy and stated he had no insulin or supplies or meds at home --but pt left AMA Tobacco smoker 0.5 ppd Mild emphysema seen on CTa done 1/26 when pt came to ED  Esophagitis was noted on CTa done 1/26 Hyperglycemia Malabsorption   CODE STATUS: Full code   PAST MEDICAL HISTORY: Past Medical History  Diagnosis Date  . Diabetes mellitus   . HTN (hypertension)   . Cirrhosis of liver (HCC)   . Hepatitis C   . Neuropathy (HCC)     PAST SURGICAL HISTORY:  Past Surgical History  Procedure Laterality Date  . Mandible surgery    . Dental surgery      teeth extraction    Vital Signs: BP 145/100 mmHg  Pulse 86  Temp(Src) 97.1 F (36.2 C) (Oral)  Resp 16  Ht 5\' 7"  (1.702 m)  Wt 40.824 kg (90 lb)  BMI 14.09 kg/m2  SpO2 100% Filed Weights   11/04/15 1734  Weight: 40.824 kg (90 lb)    Estimated body mass index is 14.09 kg/(m^2) as  calculated from the following:   Height as of this encounter: 5\' 7"  (1.702 m).   Weight as of this encounter: 40.824 kg (90 lb).  PHYSICAL EXAM: Holding his head up in bed Eyes open He does not answer me intelligibly Temporal wasting Sunken eyes and cheeks No JVD or TM Hrt rrr no m Lungs with decreased Bs bases ABd flat and nl BS Ext wasted musculature  Skin warm and dry  LABS: CBC:    Component Value Date/Time   WBC 11.3* 11/12/2015 0505   WBC 3.9 08/06/2014 1327   HGB 6.9* 11/12/2015 0505   HGB 12.6* 08/06/2014 1327   HCT 21.0* 11/12/2015 0505   HCT 38.6* 08/06/2014 1327   PLT 13* 11/12/2015 0505   PLT 88* 08/06/2014 1327   MCV 85.7 11/12/2015 0505   MCV 100 08/06/2014 1327   NEUTROABS 9.2* 11/04/2015 1737   NEUTROABS 1.5 05/07/2014  0955   LYMPHSABS 0.5* 11/04/2015 1737   LYMPHSABS 2.3 05/07/2014 0955   MONOABS 1.2* 11/04/2015 1737   MONOABS 0.2 05/07/2014 0955   EOSABS 0.0 11/04/2015 1737   EOSABS 0.0 05/07/2014 0955   BASOSABS 0.0 11/04/2015 1737   BASOSABS 0.0 05/07/2014 0955   Comprehensive Metabolic Panel:    Component Value Date/Time   NA 136 11/12/2015 0505   NA 133* 08/06/2014 1327   K 4.0 11/12/2015 0505   K 3.6 08/06/2014 1327   CL 106 11/12/2015 0505   CL 97* 08/06/2014 1327   CO2 23 11/12/2015 0505   CO2 31 08/06/2014 1327   BUN 47* 11/12/2015 0505   BUN 7 08/06/2014 1327   CREATININE 0.48* 11/12/2015 0505   CREATININE 1.07 08/06/2014 1327   GLUCOSE 288* 11/12/2015 0505   GLUCOSE 445* 08/06/2014 1327   CALCIUM 7.4* 11/12/2015 0505   CALCIUM 8.1* 08/06/2014 1327   AST 172* 11/07/2015 0555   AST 86* 08/06/2014 1327   ALT 105* 11/07/2015 0555   ALT 92* 08/06/2014 1327   ALKPHOS 833* 11/07/2015 0555   ALKPHOS 139* 08/06/2014 1327   BILITOT 7.7* 11/07/2015 0555   BILITOT 1.0 08/06/2014 1327   PROT 5.2* 11/07/2015 0555   PROT 7.2 08/06/2014 1327   ALBUMIN 1.7* 11/07/2015 0555   ALBUMIN 2.9* 08/06/2014 1327    More than 50% of the  visit was spent in counseling/coordination of care: YES  Time Spent:  25 min

## 2015-11-12 NOTE — Plan of Care (Signed)
Problem: Health Behavior/Discharge Planning: Goal: Ability to manage health-related needs will improve Outcome: Not Progressing Definite decline in condition. Palliative consult. Poor po intake. CBG had been elevated > 400. Pt had been on IV Dextrose previous due to hypoglycemia. MD notified x 2. Novolog 10 units given x 2. CBG trended down to 374. Next check will be around 0600. IV fluids discontinued per MD instruction.   Problem: Skin Integrity: Goal: Risk for impaired skin integrity will decrease Outcome: Not Progressing Pt emaciated with poor po intake. Low platelet count. MD aware. Dysphagia diet. Total care.

## 2015-11-12 NOTE — Progress Notes (Signed)
PT Cancellation Note  Patient Details Name: Ian [name] MRN: [id number] DOB: 07-12-1951   Cancelled Treatment:    Reason Eval/Treat Not Completed: Medical issues which prohibited therapy (Per chart review, patient noted with HgB at 6.9, platelet count at 13.  Contraindicated for therapy at this time.  Has consistently been unable/medically unstable to participate x3 consecutive visits at this time; will attempt one additional visit.  If remains unable to participate, will discontinue PT order next date.)   Laury AxonKristen H Shakisha Abend 11/12/2015, 10:21 AM

## 2015-11-13 LAB — TYPE AND SCREEN
ABO/RH(D): B POS
ANTIBODY SCREEN: NEGATIVE
Unit division: 0

## 2015-11-13 LAB — GLUCOSE, CAPILLARY
GLUCOSE-CAPILLARY: 115 mg/dL — AB (ref 65–99)
Glucose-Capillary: 244 mg/dL — ABNORMAL HIGH (ref 65–99)
Glucose-Capillary: 299 mg/dL — ABNORMAL HIGH (ref 65–99)
Glucose-Capillary: 309 mg/dL — ABNORMAL HIGH (ref 65–99)
Glucose-Capillary: 390 mg/dL — ABNORMAL HIGH (ref 65–99)

## 2015-11-13 LAB — DIRECT PLATELET ANTIBODY

## 2015-11-13 LAB — CBC
HCT: 27.9 % — ABNORMAL LOW (ref 40.0–52.0)
Hemoglobin: 9.2 g/dL — ABNORMAL LOW (ref 13.0–18.0)
MCH: 28.2 pg (ref 26.0–34.0)
MCHC: 33 g/dL (ref 32.0–36.0)
MCV: 85.4 fL (ref 80.0–100.0)
PLATELETS: 14 10*3/uL — AB (ref 150–440)
RBC: 3.27 MIL/uL — ABNORMAL LOW (ref 4.40–5.90)
RDW: 14.8 % — AB (ref 11.5–14.5)
WBC: 13.4 10*3/uL — ABNORMAL HIGH (ref 3.8–10.6)

## 2015-11-13 LAB — BASIC METABOLIC PANEL
Anion gap: 3 — ABNORMAL LOW (ref 5–15)
BUN: 47 mg/dL — AB (ref 6–20)
CO2: 26 mmol/L (ref 22–32)
CREATININE: 0.4 mg/dL — AB (ref 0.61–1.24)
Calcium: 8 mg/dL — ABNORMAL LOW (ref 8.9–10.3)
Chloride: 111 mmol/L (ref 101–111)
GFR calc Af Amer: >60 mL/min (ref >60)
GLUCOSE: 123 mg/dL — AB (ref 65–99)
Potassium: 3.9 mmol/L (ref 3.5–5.1)
SODIUM: 140 mmol/L (ref 135–145)

## 2015-11-13 MED ORDER — MEGESTROL ACETATE 400 MG/10ML PO SUSP: 400.0000 mg | Freq: Two times a day (BID) | ORAL | Status: DC

## 2015-11-13 MED ORDER — CEFUROXIME AXETIL 250 MG PO TABS
500.0000 mg | ORAL_TABLET | Freq: Two times a day (BID) | ORAL | Status: DC
  Administered 2015-11-13 – 2015-11-14 (×2): 500 mg via ORAL
  Filled 2015-11-13 (×2): qty 2

## 2015-11-13 MED ORDER — ENSURE ENLIVE PO LIQD: 237.0000 mL | Freq: Three times a day (TID) | ORAL | Status: DC

## 2015-11-13 MED ORDER — CEFUROXIME AXETIL 500 MG PO TABS: 500.0000 mg | ORAL_TABLET | Freq: Two times a day (BID) | ORAL | Status: DC

## 2015-11-13 MED ORDER — CITALOPRAM HYDROBROMIDE 10 MG PO TABS: 10.0000 mg | ORAL_TABLET | Freq: Every day | ORAL | Status: DC

## 2015-11-13 MED ORDER — INSULIN ASPART 100 UNIT/ML ~~LOC~~ SOLN
0.0000 [IU] | Freq: Three times a day (TID) | SUBCUTANEOUS | Status: DC
  Administered 2015-11-13: 13:00:00 12 [IU] via SUBCUTANEOUS
  Administered 2015-11-13: 18:00:00 20 [IU] via SUBCUTANEOUS
  Administered 2015-11-14: 8 [IU] via SUBCUTANEOUS
  Filled 2015-11-13: qty 1
  Filled 2015-11-13: qty 5
  Filled 2015-11-13: qty 20
  Filled 2015-11-13: qty 12

## 2015-11-13 MED ORDER — CLONIDINE HCL 0.1 MG/24HR TD PTWK: 0.1000 mg | MEDICATED_PATCH | TRANSDERMAL | Status: DC

## 2015-11-13 MED ORDER — PREDNISONE 50 MG PO TABS: ORAL_TABLET | ORAL | Status: DC

## 2015-11-13 MED ORDER — MEGESTROL ACETATE 400 MG/10ML PO SUSP
400.0000 mg | Freq: Two times a day (BID) | ORAL | Status: DC
  Administered 2015-11-13 – 2015-11-14 (×3): 400 mg via ORAL
  Filled 2015-11-13 (×3): qty 10

## 2015-11-13 NOTE — Progress Notes (Signed)
Palliative Medicine Inpatient Consult Follow Up Note   Name: Ian [name] Date: 11/13/2015 MRN: [id number]  DOB: [birth year]  Referring Physician: Auburn BilberryShreyang Patel, MD  Palliative Care consult requested for this 65 y.o. male for goals of medical therapy in patient approaching end of life due to multiple medical problems.   TODAY'S DISCUSSIONS AND DECISIONS: 1.  I see that pt is to go [facility] Saturday.  He apparently ate much better today.    2.  Pt still has very limited life expectancy. He could bleed to death at any time.  This is mostly due to liver disease and severe malnutrition causing very low platelet counts and high INR etc.  He has been transfused platelets and red blood cells. He does not respond well to platelet transfusions and prednisone is ordered though it is not helping as this is likely not immune related but production related.   3.  I tried to call pts daughter, Ian [name].  I was hoping that perhaps she had gotten out of jail by now. But, her mother answered and tells me that Ian [name] and the two sons are all in jail and cannot be easily reached.  There are apparently two additional children but one's whereabouts are not known and another one has cognitive developmental disability (MR).  Ian [name]'s mother, Ian [name] [phone]), is in favor of DNR and comfort terminal care based on my brief talk with her by phone today. But, she is not a Management consultantdecision maker. She has talked with her daughter about being more realistic about her father's health and the fact that he seems to be close to dying.  Sunny SchleinFelicia is most concerned that Ian [name] 'gets right with God' before he dies.    4.  Pt will therefore remain Full Code.  I would recommend that GUARDIANSHIP BE PURSUED.  Dr. Toni Amendlapacs psychiatry note clearly spells out that pt lacks capacity to make any decisions for himself and he is not going to get this back.    IMPRESSION: Sepsis due to pyelonephritis --Blood cultures pos for e  coli/ GNR in aerobic and anaerobic cxs Acute Pyelonephritis Cirrhosis--alcoholic --with ascites --MELD score 21 and CTP C giving him limited life expectancy --Total bilirubin 6.7 on 3/8 --with coagulopathy (INR 1.9) Severe Thrombocytopenia--persistent  --platelets have been down to 17 (no bleeding as yet) Kidney Stone Atelectasis Chronic Calcific Pancreatitis Severe Hypoglycemia  --Glucose less than 10 today requiring 2 amps of D50  --probably related to depleted glycogen stores, liver dz, and starvation Severe malnutrition and cachexia --related to substance/ alcohol abuse with very poor oral intake of food Hepatitis C ---Was referred to [facility] in [place] but not known if he went there Cocaine Use  --UDS was positive for cocaine at admission (along with cannabinoid) Severe Malnutrition due to substance abuse and limited intake  ---BMI is 14 Possible dysphagia (trouble swallowing solids--SLP to follow) Metabolic Encephalopathy Chronic Pain and chronic Peripheral Neuropathy Elevated Troponins --due to demand ischemia Lactic Acidosis Malignant HTN at admission (BP 203/ 119) Nausea and Vomiting Alcoholism (though alcohol in blood was low at adm --reports he drinks 3 cans beer / week Diverticulosis and Hemorrhois seen on colonoscopy in 2015 H/O Pancreatic dust stones --[facility]US / [facility] DM2 ---seen in ED on 2/2 with glucose 399 and c/o neuropathy and stated he had no insulin or supplies or meds at home --but pt left AMA Tobacco smoker 0.5 ppd Mild emphysema seen on CTa done 1/26 when pt came to ED  Esophagitis was noted on CTa done 1/26 Hyperglycemia Malabsorption   CODE STATUS: Full code   NOTE That I was not able to contact Ian [name] or pts two sons --all of which are in jail.  I am unable to discuss code status with any responsible close relative though I did call Ian [name]'s number.  I ended up talking with her mother.    PAST MEDICAL HISTORY: Past Medical History   Diagnosis Date  . Diabetes mellitus   . HTN (hypertension)   . Cirrhosis of liver (HCC)   . Hepatitis C   . Neuropathy (HCC)     PAST SURGICAL HISTORY:  Past Surgical History  Procedure Laterality Date  . Mandible surgery    . Dental surgery      teeth extraction    Vital Signs: BP 117/84 mmHg  Pulse 83  Temp(Src) 97.5 F (36.4 C) (Oral)  Resp 18  Ht  (1.702 m)  Wt 40.824 kg (90 lb)  BMI 14.09 kg/m2  SpO2 100% Filed Weights   11/04/15 1734  Weight: 40.824 kg (90 lb)    Estimated body mass index is 14.09 kg/(m^2) as calculated from the following:   Height as of this encounter:  (1.702 m).   Weight as of this encounter: 40.824 kg (90 lb).  PHYSICAL EXAM: Weak Thin Alert No JVD or TM Hrt rrr  Lungs decreased BS bases Abd soft and NT Emaciated with skeletal ribs showing  LABS: CBC:    Component Value Date/Time   WBC 13.4* 11/13/2015 0447   WBC 3.9 08/06/2014 1327   HGB 9.2* 11/13/2015 0447   HGB 12.6* 08/06/2014 1327   HCT 27.9* 11/13/2015 0447   HCT 38.6* 08/06/2014 1327   PLT 14* 11/13/2015 0447   PLT 88* 08/06/2014 1327   MCV 85.4 11/13/2015 0447   MCV 100 08/06/2014 1327   NEUTROABS 9.2* 11/04/2015 1737   NEUTROABS 1.5 05/07/2014 0955   LYMPHSABS 0.5* 11/04/2015 1737   LYMPHSABS 2.3 05/07/2014 0955   MONOABS 1.2* 11/04/2015 1737   MONOABS 0.2 05/07/2014 0955   EOSABS 0.0 11/04/2015 1737   EOSABS 0.0 05/07/2014 0955   BASOSABS 0.0 11/04/2015 1737   BASOSABS 0.0 05/07/2014 0955   Comprehensive Metabolic Panel:    Component Value Date/Time   NA 140 11/13/2015 0447   NA 133* 08/06/2014 1327   K 3.9 11/13/2015 0447   K 3.6 08/06/2014 1327   CL 111 11/13/2015 0447   CL 97* 08/06/2014 1327   CO2 26 11/13/2015 0447   CO2 31 08/06/2014 1327   BUN 47* 11/13/2015 0447   BUN 7 08/06/2014 1327   CREATININE 0.40* 11/13/2015 0447   CREATININE 1.07 08/06/2014 1327   GLUCOSE 123* 11/13/2015 0447   GLUCOSE 445* 08/06/2014 1327   CALCIUM  8.0* 11/13/2015 0447   CALCIUM 8.1* 08/06/2014 1327   AST 172* 11/07/2015 0555   AST 86* 08/06/2014 1327   ALT 105* 11/07/2015 0555   ALT 92* 08/06/2014 1327   ALKPHOS 833* 11/07/2015 0555   ALKPHOS 139* 08/06/2014 1327   BILITOT 7.7* 11/07/2015 0555   BILITOT 1.0 08/06/2014 1327   PROT 5.2* 11/07/2015 0555   PROT 7.2 08/06/2014 1327   ALBUMIN 1.7* 11/07/2015 0555   ALBUMIN 2.9* 08/06/2014 1327    Time Spent:  25 min

## 2015-11-13 NOTE — Clinical Social Work Note (Signed)
Pt was ready for discharge today. Pt was initialy going to [facility], however CSW was informed that pt would have to pay [phone] (30-day private pay) upfront in order to admit due to pt having Medicaid. This is policy of [facility]. Family is unable to afford it. Other bed offers were provided, [facility] was chosen. Pt is able to admit and does not have to pay upfront. Pt will have to stay in facility for 30 days. This was explained to pt's niece and sister, Eber JonesCarolyn and AnthonGwen, respectively. CSW notified MD that pt will be able to [facility] to [facility] [place], [place] ([place] hour away from [facility]), on [address]. Pt's sister, Eber JonesCarolyn, will sign pt in. CSW will also update [facility] of transfer. CSW will continue to follow.   Dede QuerySarah Yosmar Ryker, MSW,LCSW  Clinical Social Worker  [phone]

## 2015-11-13 NOTE — Discharge Summary (Addendum)
Ian CatchingBobby L [name], 65 y.o., DOB 28-Aug-1951, MRN [id number]. Admission date: 11/04/2015 Discharge Date 11/14/2015 Primary MD Rico JunkerHANNE, [name], PA Admitting Physician Enid Baasadhika Kalisetti, MD  Admission Diagnosis  Hypoglycemia [E16.2] Jaundice [R17] UTI (lower urinary tract infection) [N39.0] Essential hypertension [I10] Abdominal pain [R10.9] Liver failure without hepatic coma, unspecified chronicity (HCC) [K72.90]  Discharge Diagnosis   Principal Problem:   Acute encephalopathy due to Escherichia coli bacteremia associated with UTI   Jaundice due to alcohol abuse Alcoholic cirrhosis   Protein-calorie malnutrition, severe Substance abuse including alcohol and cocaine   Dementia with behavioral disturbance   Cocaine abuse   Alcohol abuse   Cannabis abuse   Thrombocytopenia (HCC) felt to be multifactorial including liver cirrhosis  Anemia likely related to his liver disease no evidence of bleeding and anemia of chronic disease         Hospital CourseBobby [name] is a 65 y.o. male with a known history of diabetes mellitus, hypertension, alcoholic liver cirrhosis, chronic peripheral neuropathy sense from home secondary to poor by mouth intake and failure to thrive. Patient was noted to have urinary tract infection. Subsequent evaluation showed that he had Escherichia coli in his blood cultures. Patient was treated with IV antibiotics now switched over to oral antibiotics. Patient also was noticed to have jaundice and further evaluation revealed that he had liver cirrhosis. Patient was seen by GI supportive care was recommended. He also has hepatitis C for which supportive care was recommended. Patient developed severe thrombocytopenia in the hospital. And received platelet transfusion. He was seen by hematology who felt that the thrombocytopenia was multifactorial. Patient's platelet count continues to be low however stable this will need to be repeated as outpatient. Patient also had acute  encephalopathy mental status waxing and waning but is much improved. He was also not eating or drinking but is able to eat and drink better. GI consult was obtained due to patient having poor intake but with his thrombocytopenia they recommended no PEG tube. He is started on Megace. He is in need of rehabilitation which is currently being arranged.             Consults  GI, hematology  Significant Tests:  See full reports for all details      Ct Abdomen Pelvis W Contrast  11/04/2015  CLINICAL DATA:  Chronic abdominal pain. History of cirrhosis. Initial encounter. EXAM: CT ABDOMEN AND PELVIS WITH CONTRAST TECHNIQUE: Multidetector CT imaging of the abdomen and pelvis was performed using the standard protocol following bolus administration of intravenous contrast. CONTRAST:  75 ml OMNIPAQUE IOHEXOL 300 MG/ML  SOLN COMPARISON:  CT chest, abdomen and pelvis 06/23/2014. FINDINGS: There is no pleural or pericardial effusion. Heart size is normal. There is some mild atelectasis or scar in the left lung base. The patient is cachectic. There is diffuse body wall and mesenteric edema and some abdominal and pelvic ascites. No drainable collection is seen. A 0.6 cm nonobstructing stone is seen in the midpole of the right kidney. Contrast excretion from the right kidney is delayed and there are areas of cortical hypoattenuation on delayed imaging. No obstructing stone is seen. The urinary bladder is unremarkable. The spleen and adrenal glands appear normal. Chronic pancreatic ductal dilatation with extensive calcifications throughout the pancreas consistent with chronic pancreatitis appear unchanged. There is a small low attenuating lesion in the left hepatic lobe on image 12 is unchanged. The liver is otherwise unremarkable. The gallbladder appears normal. Small bowel loops are largely decompressed but otherwise unremarkable. The colon  and stomach appear normal. The appendix is not visualized but no focal  inflammatory process is seen. Aortoiliac atherosclerosis without aneurysm is identified. No focal bony abnormality is seen. IMPRESSION: Cachectic appearing patient with diffuse body wall and mesenteric edema consistent with anasarca. Abnormal enhancement pattern right kidney is most worrisome for pyelonephritis. Nonobstructing stone midpole right kidney. Chronic calcific pancreatitis. Atherosclerosis. Electronically Signed   By: Drusilla Kanner M.D.   On: 11/04/2015 20:35       Today   Subjective:   Ian [name]  patient wants to go home  Objective:   Blood pressure 114/72, pulse 91, temperature 97.4 F (36.3 C), temperature source Oral, resp. rate 16, height 5\' 7"  (1.702 m), weight 40.824 kg (90 lb), SpO2 99 %.  .   Exam VITAL SIGNS: Blood pressure 114/72, pulse 91, temperature 97.4 F (36.3 C), temperature source Oral, resp. rate 16, height 5\' 7"  (1.702 m), weight 40.824 kg (90 lb), SpO2 99 %.  GENERAL:  65 y.o.-year-old patient Cachectic  EYES: Pupils equal, round, reactive to light and accommodation. No scleral icterus. Extraocular muscles intact.  HEENT: Head atraumatic, normocephalic. Oropharynx and nasopharynx clear.  NECK:  Supple, no jugular venous distention. No thyroid enlargement, no tenderness.  LUNGS: Normal breath sounds bilaterally, no wheezing, rales,rhonchi or crepitation. No use of accessory muscles of respiration.  CARDIOVASCULAR: S1, S2 normal. No murmurs, rubs, or gallops.  ABDOMEN: Soft, nontender, nondistended. Bowel sounds present. No organomegaly or mass.  EXTREMITIES: No pedal edema, cyanosis, or clubbing.  NEUROLOGIC: Cranial nerves II through XII are intact. Muscle strength 5/5 in all extremities. Sensation intact. Gait not checked.  PSYCHIATRIC: The patient is alertoriented to person not place or time.  SKIN: No obvious rash, lesion, or ulcer.   Data Review     CBC w Diff:  Lab Results  Component Value Date   WBC 13.9* 11/14/2015   WBC 3.9  08/06/2014   HGB 8.5* 11/14/2015   HGB 12.6* 08/06/2014   HCT 25.2* 11/14/2015   HCT 38.6* 08/06/2014   PLT 18* 11/14/2015   PLT 88* 08/06/2014   LYMPHOPCT 5 11/04/2015   LYMPHOPCT 56.4 05/07/2014   MONOPCT 11 11/04/2015   MONOPCT 5.3 05/07/2014   EOSPCT 0 11/04/2015   EOSPCT 0.4 05/07/2014   BASOPCT 0 11/04/2015   BASOPCT 0.6 05/07/2014   CMP:  Lab Results  Component Value Date   NA 135 11/14/2015   NA 133* 08/06/2014   K 4.4 11/14/2015   K 3.6 08/06/2014   CL 105 11/14/2015   CL 97* 08/06/2014   CO2 24 11/14/2015   CO2 31 08/06/2014   BUN 51* 11/14/2015   BUN 7 08/06/2014   CREATININE 0.44* 11/14/2015   CREATININE 1.07 08/06/2014   PROT 5.2* 11/07/2015   PROT 7.2 08/06/2014   ALBUMIN 1.7* 11/07/2015   ALBUMIN 2.9* 08/06/2014   BILITOT 7.7* 11/07/2015   BILITOT 1.0 08/06/2014   ALKPHOS 833* 11/07/2015   ALKPHOS 139* 08/06/2014   AST 172* 11/07/2015   AST 86* 08/06/2014   ALT 105* 11/07/2015   ALT 92* 08/06/2014  .  Micro Results Recent Results (from the past 240 hour(s))  Urine culture     Status: None   Collection Time: 11/04/15  5:37 PM  Result Value Ref Range Status   Specimen Description URINE, RANDOM  Final   Special Requests NONE  Final   Culture >=100,000 COLONIES/mL ESCHERICHIA COLI  Final   Report Status 11/07/2015 FINAL  Final   Organism ID, Bacteria ESCHERICHIA  COLI  Final      Susceptibility   Escherichia coli - MIC*    AMPICILLIN >=32 RESISTANT Resistant     CEFAZOLIN <=4 SENSITIVE Sensitive     CEFTRIAXONE <=1 SENSITIVE Sensitive     CIPROFLOXACIN <=0.25 SENSITIVE Sensitive     GENTAMICIN <=1 SENSITIVE Sensitive     IMIPENEM <=0.25 SENSITIVE Sensitive     NITROFURANTOIN <=16 SENSITIVE Sensitive     TRIMETH/SULFA <=20 SENSITIVE Sensitive     AMPICILLIN/SULBACTAM 16 INTERMEDIATE Intermediate     PIP/TAZO <=4 SENSITIVE Sensitive     Extended ESBL NEGATIVE Sensitive     * >=100,000 COLONIES/mL ESCHERICHIA COLI  Culture, blood (routine x  2)     Status: None   Collection Time: 11/04/15  7:40 PM  Result Value Ref Range Status   Specimen Description BLOOD LEFT  Final   Special Requests BOTTLES DRAWN AEROBIC AND ANAEROBIC  Final   Culture  Setup Time   Final    GRAM NEGATIVE RODS IN BOTH AEROBIC AND ANAEROBIC BOTTLES CRITICAL RESULT CALLED TO, READ BACK BY AND VERIFIED WITH: CHRISTINE KATSOUDAS 11/05/15 1100 MLM    Culture   Final    ESCHERICHIA COLI IN BOTH AEROBIC AND ANAEROBIC BOTTLES    Report Status 11/07/2015 FINAL  Final   Organism ID, Bacteria ESCHERICHIA COLI  Final      Susceptibility   Escherichia coli - MIC*    AMPICILLIN >=32 RESISTANT Resistant     CEFAZOLIN <=4 SENSITIVE Sensitive     CEFEPIME <=1 SENSITIVE Sensitive     CEFTAZIDIME <=1 SENSITIVE Sensitive     CEFTRIAXONE <=1 SENSITIVE Sensitive     CIPROFLOXACIN <=0.25 SENSITIVE Sensitive     GENTAMICIN <=1 SENSITIVE Sensitive     IMIPENEM <=0.25 SENSITIVE Sensitive     TRIMETH/SULFA <=20 SENSITIVE Sensitive     AMPICILLIN/SULBACTAM 16 INTERMEDIATE Intermediate     PIP/TAZO <=4 SENSITIVE Sensitive     Extended ESBL NEGATIVE Sensitive     * ESCHERICHIA COLI  Blood Culture ID Panel (Reflexed)     Status: Abnormal   Collection Time: 11/04/15  7:40 PM  Result Value Ref Range Status   Enterococcus species NOT DETECTED NOT DETECTED Final   Vancomycin resistance NOT DETECTED NOT DETECTED Final   Listeria monocytogenes NOT DETECTED NOT DETECTED Final   Staphylococcus species NOT DETECTED NOT DETECTED Final   Staphylococcus aureus NOT DETECTED NOT DETECTED Final   Methicillin resistance NOT DETECTED NOT DETECTED Final   Streptococcus species NOT DETECTED NOT DETECTED Final   Streptococcus agalactiae NOT DETECTED NOT DETECTED Final   Streptococcus pneumoniae NOT DETECTED NOT DETECTED Final   Streptococcus pyogenes NOT DETECTED NOT DETECTED Final   Acinetobacter baumannii NOT DETECTED NOT DETECTED Final   Enterobacteriaceae species DETECTED (A) NOT  DETECTED Final    Comment: CRITICAL RESULT CALLED TO, READ BACK BY AND VERIFIED WITH: CHRISTINE KATSOUDAS 11/05/15 1100 MLM    Enterobacter cloacae complex NOT DETECTED NOT DETECTED Final   Escherichia coli DETECTED (A) NOT DETECTED Final    Comment: CRITICAL RESULT CALLED TO, READ BACK BY AND VERIFIED WITH: CHRISTINE KATSOUDAS 11/05/15 1100 MLM    Klebsiella oxytoca NOT DETECTED NOT DETECTED Final   Klebsiella pneumoniae NOT DETECTED NOT DETECTED Final   Proteus species NOT DETECTED NOT DETECTED Final   Serratia marcescens NOT DETECTED NOT DETECTED Final   Carbapenem resistance NOT DETECTED NOT DETECTED Final   Haemophilus influenzae NOT DETECTED NOT DETECTED Final   Neisseria meningitidis NOT DETECTED NOT DETECTED  Final   Pseudomonas aeruginosa NOT DETECTED NOT DETECTED Final   Candida albicans NOT DETECTED NOT DETECTED Final   Candida glabrata NOT DETECTED NOT DETECTED Final   Candida krusei NOT DETECTED NOT DETECTED Final   Candida parapsilosis NOT DETECTED NOT DETECTED Final   Candida tropicalis NOT DETECTED NOT DETECTED Final  Culture, blood (routine x 2)     Status: None   Collection Time: 11/04/15  7:45 PM  Result Value Ref Range Status   Specimen Description BLOOD LEFT ASSIST CONTROL  Final   Special Requests BOTTLES DRAWN AEROBIC AND ANAEROBIC  Final   Culture  Setup Time   Final    GRAM NEGATIVE RODS IN BOTH AEROBIC AND ANAEROBIC BOTTLES CRITICAL RESULT CALLED TO, READ BACK BY AND VERIFIED WITH: CHRISTINE KATSOUDAS 11/05/15 1100 MLM    Culture   Final    ESCHERICHIA COLI IN BOTH AEROBIC AND ANAEROBIC BOTTLES    Report Status 11/07/2015 FINAL  Final   Organism ID, Bacteria ESCHERICHIA COLI  Final      Susceptibility   Escherichia coli - MIC*    AMPICILLIN >=32 RESISTANT Resistant     CEFAZOLIN <=4 SENSITIVE Sensitive     CEFEPIME <=1 SENSITIVE Sensitive     CEFTAZIDIME <=1 SENSITIVE Sensitive     CEFTRIAXONE <=1 SENSITIVE Sensitive     CIPROFLOXACIN <=0.25  SENSITIVE Sensitive     GENTAMICIN <=1 SENSITIVE Sensitive     IMIPENEM <=0.25 SENSITIVE Sensitive     TRIMETH/SULFA <=20 SENSITIVE Sensitive     AMPICILLIN/SULBACTAM 16 INTERMEDIATE Intermediate     PIP/TAZO <=4 SENSITIVE Sensitive     Extended ESBL NEGATIVE Sensitive     * ESCHERICHIA COLI        Code Status Orders        Start     Ordered   11/04/15 2157  Full code   Continuous     11/04/15 2156    Code Status History    Date Active Date Inactive Code Status Order ID Comments User Context   This patient has a current code status but no historical code status.              Follow-up Information    Follow up with [facility] .   Specialty:  Skilled Nursing Facility   Contact information:   [address] [place] [address] [phone]      Discharge Medications     Medication List    TAKE these medications        cefUROXime 500 MG tablet  Commonly known as:  CEFTIN  Take 1 tablet (500 mg total) by mouth 2 (two) times daily with a meal.     citalopram 10 MG tablet  Commonly known as:  CELEXA  Take 1 tablet (10 mg total) by mouth daily.     cloNIDine 0.1 mg/24hr patch  Commonly known as:  CATAPRES - Dosed in mg/24 hr  Place 1 patch (0.1 mg total) onto the skin once a week.     feeding supplement (ENSURE ENLIVE) Liqd  Take 237 mLs by mouth 3 (three) times daily with meals.     megestrol 400 MG/10ML suspension  Commonly known as:  MEGACE  Take 10 mLs (400 mg total) by mouth 2 (two) times daily.     predniSONE 10 MG tablet  Commonly known as:  DELTASONE  Take 3 tablets po day1; take 2 tabs po day2; take one tablet day3; take 1/2 tablet day4,5  Total Time in preparing paper work, data evaluation and todays exam - 35 minutes  [place], Gerlene Burdock M.D on 11/14/2015 at 8:13 AM  [facility]   Office  [phone]

## 2015-11-13 NOTE — Discharge Instructions (Signed)
°  DIET:  Dysphagia 1 diet with thin liquids  DISCHARGE CONDITION:  Stable  ACTIVITY:  Activity as tolerated  OXYGEN:  Home Oxygen: No.   Oxygen Delivery: room air  DISCHARGE LOCATION:  snf   ADDITIONAL DISCHARGE INSTRUCTION:check cbc in 7 days   If you experience worsening of your admission symptoms, develop shortness of breath, life threatening emergency, suicidal or homicidal thoughts you must seek medical attention immediately by calling 911 or calling your MD immediately  if symptoms less severe.  You Must read complete instructions/literature along with all the possible adverse reactions/side effects for all the Medicines you take and that have been prescribed to you. Take any new Medicines after you have completely understood and accpet all the possible adverse reactions/side effects.   Please note  You were cared for by a hospitalist during your hospital stay. If you have any questions about your discharge medications or the care you received while you were in the hospital after you are discharged, you can call the unit and asked to speak with the hospitalist on call if the hospitalist that took care of you is not available. Once you are discharged, your primary care physician will handle any further medical issues. Please note that NO REFILLS for any discharge medications will be authorized once you are discharged, as it is imperative that you return to your primary care physician (or establish a relationship with a primary care physician if you do not have one) for your aftercare needs so that they can reassess your need for medications and monitor your lab values.

## 2015-11-14 LAB — CBC
HCT: 25.2 % — ABNORMAL LOW (ref 40.0–52.0)
Hemoglobin: 8.5 g/dL — ABNORMAL LOW (ref 13.0–18.0)
MCH: 29.4 pg (ref 26.0–34.0)
MCHC: 33.9 g/dL (ref 32.0–36.0)
MCV: 86.7 fL (ref 80.0–100.0)
PLATELETS: 18 10*3/uL — AB (ref 150–440)
RBC: 2.9 MIL/uL — AB (ref 4.40–5.90)
RDW: 15.1 % — AB (ref 11.5–14.5)
WBC: 13.9 10*3/uL — AB (ref 3.8–10.6)

## 2015-11-14 LAB — BASIC METABOLIC PANEL
ANION GAP: 6 (ref 5–15)
BUN: 51 mg/dL — ABNORMAL HIGH (ref 6–20)
CALCIUM: 8 mg/dL — AB (ref 8.9–10.3)
CO2: 24 mmol/L (ref 22–32)
Chloride: 105 mmol/L (ref 101–111)
Creatinine, Ser: 0.44 mg/dL — ABNORMAL LOW (ref 0.61–1.24)
GLUCOSE: 245 mg/dL — AB (ref 65–99)
POTASSIUM: 4.4 mmol/L (ref 3.5–5.1)
Sodium: 135 mmol/L (ref 135–145)

## 2015-11-14 LAB — GLUCOSE, CAPILLARY
GLUCOSE-CAPILLARY: 226 mg/dL — AB (ref 65–99)
GLUCOSE-CAPILLARY: 217 mg/dL — AB (ref 65–99)
GLUCOSE-CAPILLARY: 213 mg/dL — AB (ref 65–99)

## 2015-11-14 MED ORDER — CLONIDINE HCL 0.1 MG/24HR TD PTWK: 0.1000 mg | MEDICATED_PATCH | TRANSDERMAL | Status: AC

## 2015-11-14 MED ORDER — PREDNISONE 10 MG PO TABS: ORAL_TABLET | ORAL | Status: AC

## 2015-11-14 MED ORDER — PREDNISONE 20 MG PO TABS: 30.0000 mg | ORAL_TABLET | Freq: Every day | ORAL | Status: DC

## 2015-11-14 MED ORDER — CITALOPRAM HYDROBROMIDE 10 MG PO TABS: 10.0000 mg | ORAL_TABLET | Freq: Every day | ORAL | Status: AC

## 2015-11-14 MED ORDER — ENSURE ENLIVE PO LIQD: 237.0000 mL | Freq: Three times a day (TID) | ORAL | Status: AC

## 2015-11-14 MED ORDER — CEFUROXIME AXETIL 500 MG PO TABS: 500.0000 mg | ORAL_TABLET | Freq: Two times a day (BID) | ORAL | Status: AC

## 2015-11-14 MED ORDER — MEGESTROL ACETATE 400 MG/10ML PO SUSP: 400.0000 mg | Freq: Two times a day (BID) | ORAL | Status: AC

## 2015-11-14 NOTE — Progress Notes (Signed)
Report called to [facility]. Patient to be discharged there today. Receiving RN has no questions or concerns at this time. Family is aware, and EMS has been called for transport.

## 2015-11-14 NOTE — Clinical Social Work Placement (Signed)
   CLINICAL SOCIAL WORK PLACEMENT  NOTE  Date:  11/14/2015  Patient Details  Name: Ian PellantBobby L Benton MRN: [id number] Date of Birth: 25-Dec-1950  Clinical Social Work is seeking post-discharge placement for this patient at the Skilled  Nursing Facility level of care (*CSW will initial, date and re-position this form in  chart as items are completed):  Yes   Patient/family provided with [facility] Clinical Social Work Department's list of facilities offering this level of care within the geographic area requested by the patient (or if unable, by the patient's family).  Yes   Patient/family informed of their freedom to choose among providers that offer the needed level of care, that participate in Medicare, Medicaid or managed care program needed by the patient, have an available bed and are willing to accept the patient.  Yes   Patient/family informed of 's ownership interest in [facility] and [facility], as well as of the fact that they are under no obligation to receive care at these facilities.  PASRR submitted to EDS on 11/12/15     PASRR number received on 11/12/15     Existing PASRR number confirmed on       FL2 transmitted to all facilities in geographic area requested by pt/family on 11/12/15     FL2 transmitted to all facilities within larger geographic area on 11/05/15     Patient informed that his/her managed care company has contracts with or will negotiate with certain facilities, including the following:        Yes   Patient/family informed of bed offers received.  Patient chooses bed at  [facility]([place] )     Physician recommends and patient chooses bed at      Patient to be transferred to  [facility]([place]) on 11/14/15.  Patient to be transferred to facility by  (EMS)     Patient family notified on 11/14/15 of transfer.  Name of family member notified:   Eber Jones(Carolyn Colaizzi )     PHYSICIAN       Additional Comment:     _______________________________________________ Starr SinclairSamantha L Bleu Moisan, LCSW 11/14/2015, 9:07 AM

## 2015-11-14 NOTE — Progress Notes (Addendum)
[facility] [place], [place] and spoke with Tacey RuizLeah RN at [phone] to confirm patient admission to facility. Per Tacey RuizLeah, patient has been accepted to facility and can be transported to facility today. [name] confirmed discharge orders already received. Discharge packet completed and given to nurse Sarah. Patient room number is 122-B, Person to give report to is Tacey RuizLeah, and contact number for report is [phone].  D/c summary, prescriptions, and FL2 placed in packet. [facility] CSW attempt to notify patient of d/c but patient was sleeping. CSW notified wknd APS worker of d/c via phone. Patient sister Ian [name] notified of d/c via by phone. All were in agreeance to d/c.     Sherryl MangesSamantha Kealie [name], BSW, MSW, LCSWA Clinical Social Work Dept [phone]

## 2015-11-14 NOTE — Progress Notes (Signed)
Patient ID: Ian [name], male   DOB: 1951/08/01, 65 y.o.   MRN: [id number] [facility] PROGRESS NOTE  Ian [name] WUJ:[id number] DOB: 1951/07/29 DOA: 11/04/2015 PCP: [name], PA  HPI/Subjective: Patient awakened from sleep. Offers no complaints. He did not want to talk to me this morning.  Objective: Filed Vitals:   11/13/15 2102 11/14/15 0419  BP: 117/81 114/72  Pulse: 94 91  Temp: 97.7 F (36.5 C) 97.4 F (36.3 C)  Resp: 16 16    Filed Weights   11/04/15 1734  Weight: 40.824 kg (90 lb)    ROS: Review of Systems  Unable to perform ROS  patient refused Exam: Physical Exam  HENT:  Nose: No mucosal edema.  Mouth/Throat: No oropharyngeal exudate or posterior oropharyngeal edema.  Eyes: EOM and lids are normal. Pupils are equal, round, and reactive to light.  Jaundiced  Neck: No JVD present. Carotid bruit is not present. No edema present. No thyroid mass and no thyromegaly present.  Cardiovascular: S1 normal and S2 normal.  Exam reveals no gallop.   No murmur heard. Pulses:      Dorsalis pedis pulses are 2+ on the right side, and 2+ on the left side.  Respiratory: No respiratory distress. He has no wheezes. He has no rhonchi. He has no rales.  GI: Soft. Bowel sounds are normal. There is no tenderness.  Musculoskeletal:       Right ankle: He exhibits swelling.       Left ankle: He exhibits swelling.  Lymphadenopathy:    He has no cervical adenopathy.  Neurological: He is alert.  Move his extremities on his own  Skin: Skin is warm. Nails show no clubbing.  Bruising seen on arms  Psychiatric:  Patient did not want to talk with me this morning      Data Reviewed: Basic Metabolic Panel:  Recent Labs Lab 11/08/15 0523 11/12/15 0505 11/13/15 0447 11/14/15 0530  NA 135 136 140 135  K 4.3 4.0 3.9 4.4  CL 103 106 111 105  CO2 GLUCOSE 98 288* 123* 245*  BUN 38* 47* 47* 51*  CREATININE 0.42* 0.48* 0.40* 0.44*  CALCIUM  7.9* 7.4* 8.0* 8.0*  MG 1.4*  --   --   --    CBC:  Recent Labs Lab 11/09/15 0511 11/11/15 0400 11/12/15 0505 11/13/15 0447 11/14/15 0530  WBC 14.2* 10.8* 11.3* 13.4* 13.9*  HGB 7.6* 7.4* 6.9* 9.2* 8.5*  HCT 23.3* 21.7* 21.0* 27.9* 25.2*  MCV 84.5 84.8 85.7 85.4 86.7  PLT 17* 15* 13* 14* 18*   CBG:  Recent Labs Lab 11/13/15 1152 11/13/15 1656 11/13/15 2113 11/13/15 2354 11/14/15 0624  GLUCAP 299* 390* 309* 244* 226*    Recent Results (from the past 240 hour(s))  Urine culture     Status: None   Collection Time: 11/04/15  5:37 PM  Result Value Ref Range Status   Specimen Description URINE, RANDOM  Final   Special Requests NONE  Final   Culture >=100,000 COLONIES/mL ESCHERICHIA COLI  Final   Report Status 11/07/2015 FINAL  Final   Organism ID, Bacteria ESCHERICHIA COLI  Final      Susceptibility   Escherichia coli - MIC*    AMPICILLIN >=32 RESISTANT Resistant     CEFAZOLIN <=4 SENSITIVE Sensitive     CEFTRIAXONE <=1 SENSITIVE Sensitive     CIPROFLOXACIN <=0.25 SENSITIVE Sensitive     GENTAMICIN <=1 SENSITIVE Sensitive     IMIPENEM <=0.25  SENSITIVE Sensitive     NITROFURANTOIN <=16 SENSITIVE Sensitive     TRIMETH/SULFA <=20 SENSITIVE Sensitive     AMPICILLIN/SULBACTAM 16 INTERMEDIATE Intermediate     PIP/TAZO <=4 SENSITIVE Sensitive     Extended ESBL NEGATIVE Sensitive     * >=100,000 COLONIES/mL ESCHERICHIA COLI  Culture, blood (routine x 2)     Status: None   Collection Time: 11/04/15  7:40 PM  Result Value Ref Range Status   Specimen Description BLOOD LEFT  Final   Special Requests BOTTLES DRAWN AEROBIC AND ANAEROBIC 8ML  Final   Culture  Setup Time   Final    GRAM NEGATIVE RODS IN BOTH AEROBIC AND ANAEROBIC BOTTLES CRITICAL RESULT CALLED TO, READ BACK BY AND VERIFIED WITH: CHRISTINE KATSOUDAS 11/05/15 1100 MLM    Culture   Final    ESCHERICHIA COLI IN BOTH AEROBIC AND ANAEROBIC BOTTLES    Report Status 11/07/2015 FINAL  Final   Organism ID, Bacteria  ESCHERICHIA COLI  Final      Susceptibility   Escherichia coli - MIC*    AMPICILLIN >=32 RESISTANT Resistant     CEFAZOLIN <=4 SENSITIVE Sensitive     CEFEPIME <=1 SENSITIVE Sensitive     CEFTAZIDIME <=1 SENSITIVE Sensitive     CEFTRIAXONE <=1 SENSITIVE Sensitive     CIPROFLOXACIN <=0.25 SENSITIVE Sensitive     GENTAMICIN <=1 SENSITIVE Sensitive     IMIPENEM <=0.25 SENSITIVE Sensitive     TRIMETH/SULFA <=20 SENSITIVE Sensitive     AMPICILLIN/SULBACTAM 16 INTERMEDIATE Intermediate     PIP/TAZO <=4 SENSITIVE Sensitive     Extended ESBL NEGATIVE Sensitive     * ESCHERICHIA COLI  Blood Culture ID Panel (Reflexed)     Status: Abnormal   Collection Time: 11/04/15  7:40 PM  Result Value Ref Range Status   Enterococcus species NOT DETECTED NOT DETECTED Final   Vancomycin resistance NOT DETECTED NOT DETECTED Final   Listeria monocytogenes NOT DETECTED NOT DETECTED Final   Staphylococcus species NOT DETECTED NOT DETECTED Final   Staphylococcus aureus NOT DETECTED NOT DETECTED Final   Methicillin resistance NOT DETECTED NOT DETECTED Final   Streptococcus species NOT DETECTED NOT DETECTED Final   Streptococcus agalactiae NOT DETECTED NOT DETECTED Final   Streptococcus pneumoniae NOT DETECTED NOT DETECTED Final   Streptococcus pyogenes NOT DETECTED NOT DETECTED Final   Acinetobacter baumannii NOT DETECTED NOT DETECTED Final   Enterobacteriaceae species DETECTED (A) NOT DETECTED Final    Comment: CRITICAL RESULT CALLED TO, READ BACK BY AND VERIFIED WITH: CHRISTINE KATSOUDAS 11/05/15 1100 MLM    Enterobacter cloacae complex NOT DETECTED NOT DETECTED Final   Escherichia coli DETECTED (A) NOT DETECTED Final    Comment: CRITICAL RESULT CALLED TO, READ BACK BY AND VERIFIED WITH: CHRISTINE KATSOUDAS 11/05/15 1100 MLM    Klebsiella oxytoca NOT DETECTED NOT DETECTED Final   Klebsiella pneumoniae NOT DETECTED NOT DETECTED Final   Proteus species NOT DETECTED NOT DETECTED Final   Serratia marcescens  NOT DETECTED NOT DETECTED Final   Carbapenem resistance NOT DETECTED NOT DETECTED Final   Haemophilus influenzae NOT DETECTED NOT DETECTED Final   Neisseria meningitidis NOT DETECTED NOT DETECTED Final   Pseudomonas aeruginosa NOT DETECTED NOT DETECTED Final   Candida albicans NOT DETECTED NOT DETECTED Final   Candida glabrata NOT DETECTED NOT DETECTED Final   Candida krusei NOT DETECTED NOT DETECTED Final   Candida parapsilosis NOT DETECTED NOT DETECTED Final   Candida tropicalis NOT DETECTED NOT DETECTED Final  Culture, blood (routine x  2)     Status: None   Collection Time: 11/04/15  7:45 PM  Result Value Ref Range Status   Specimen Description BLOOD LEFT ASSIST CONTROL  Final   Special Requests BOTTLES DRAWN AEROBIC AND ANAEROBIC  Final   Culture  Setup Time   Final    GRAM NEGATIVE RODS IN BOTH AEROBIC AND ANAEROBIC BOTTLES CRITICAL RESULT CALLED TO, READ BACK BY AND VERIFIED WITH: CHRISTINE KATSOUDAS 11/05/15 1100 MLM    Culture   Final    ESCHERICHIA COLI IN BOTH AEROBIC AND ANAEROBIC BOTTLES    Report Status 11/07/2015 FINAL  Final   Organism ID, Bacteria ESCHERICHIA COLI  Final      Susceptibility   Escherichia coli - MIC*    AMPICILLIN >=32 RESISTANT Resistant     CEFAZOLIN <=4 SENSITIVE Sensitive     CEFEPIME <=1 SENSITIVE Sensitive     CEFTAZIDIME <=1 SENSITIVE Sensitive     CEFTRIAXONE <=1 SENSITIVE Sensitive     CIPROFLOXACIN <=0.25 SENSITIVE Sensitive     GENTAMICIN <=1 SENSITIVE Sensitive     IMIPENEM <=0.25 SENSITIVE Sensitive     TRIMETH/SULFA <=20 SENSITIVE Sensitive     AMPICILLIN/SULBACTAM 16 INTERMEDIATE Intermediate     PIP/TAZO <=4 SENSITIVE Sensitive     Extended ESBL NEGATIVE Sensitive     * ESCHERICHIA COLI     Scheduled Meds: . antiseptic oral rinse  7 mL Mouth Rinse BID  . cefUROXime  500 mg Oral BID WC  . cloNIDine  0.1 mg Transdermal Weekly  . feeding supplement (ENSURE ENLIVE)  237 mL Oral TID WC  . insulin aspart  0-24 Units  Subcutaneous TID WC  . megestrol  400 mg Oral BID  . [START ON 11/15/2015] predniSONE  30 mg Oral Q breakfast  . sodium chloride flush  3 mL Intravenous Q12H    Assessment/Plan:  1. Bacteremia with Escherichia coli. Switched to Ceftin for another 7 days prescription written. 2. Severe hypoglycemia secondary to poor oral intake and poor liver function. His sugars are high secondary to prednisone 3. Alcoholic cirrhosis, jaundice, severe thrombocytopenia, auto anticoagulation. Overall prognosis is poor. Patient is also received platelet transfusions during the hospital course. 4. Essential hypertension on clonidine patch 5. Elevated troponin due to demand ischemia 6. History of hepatitis C 7. History of neuropathy 8. Anemia. Patient has received blood transfusions during the hospital course.  Code Status:     Code Status Orders        Start     Ordered   11/04/15 2157  Full code   Continuous     11/04/15 2156    Code Status History    Date Active Date Inactive Code Status Order ID Comments User Context   This patient has a current code status but no historical code status.     Antibiotics:  Ceftin  Time spent: 35 minutes in coordination of care speaking with the social worker, printing scrips, editing discharge summary and making sure everything is ready for discharge.  Alford Highland  [facility]

## 2015-11-28 DEATH — deceased

## 2016-11-14 IMAGING — CT CT ABD-PELV W/ CM
1 of 3 series · 14 of 32 positions shown, 19 images · IV contrast (omnipaque)
Comparison: CT chest, abdomen and pelvis 06/23/2014.

CLINICAL DATA: Chronic abdominal pain. History of cirrhosis.
Initial encounter.

EXAM:
CT ABDOMEN AND PELVIS WITH CONTRAST
TECHNIQUE: Multidetector CT imaging of the abdomen and pelvis was performed
using the standard protocol following bolus administration of
intravenous contrast.
CONTRAST:  75 ml OMNIPAQUE IOHEXOL 300 MG/ML  SOLN

[Series 2: routine abd pel with · axial · 0.68mm/px · z∈[-1142,-742]mm · 14 of 90 slices shown, 19 images]
[im 5/90  soft-tissue]
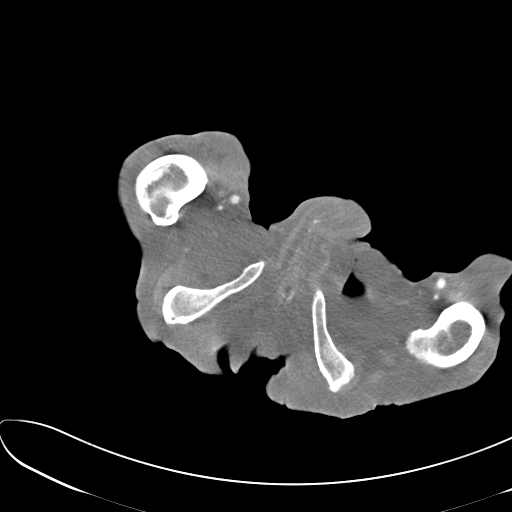
[im 5/90  bone]
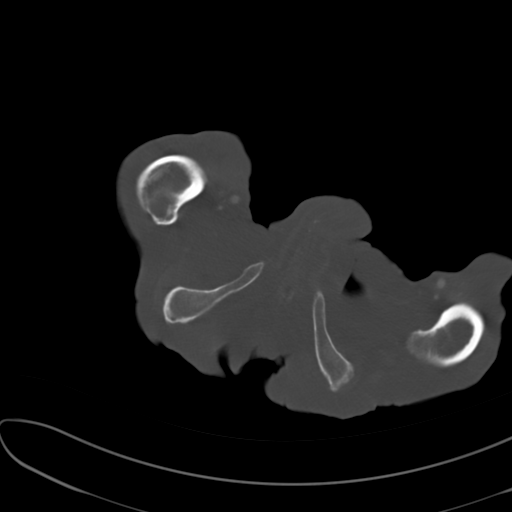
[im 14/90  soft-tissue]
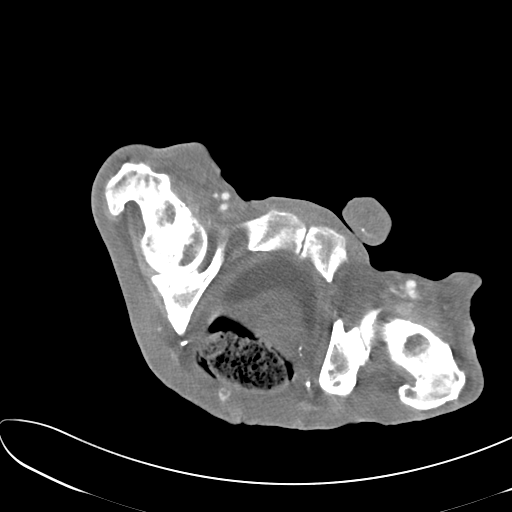
[im 18/90  soft-tissue]
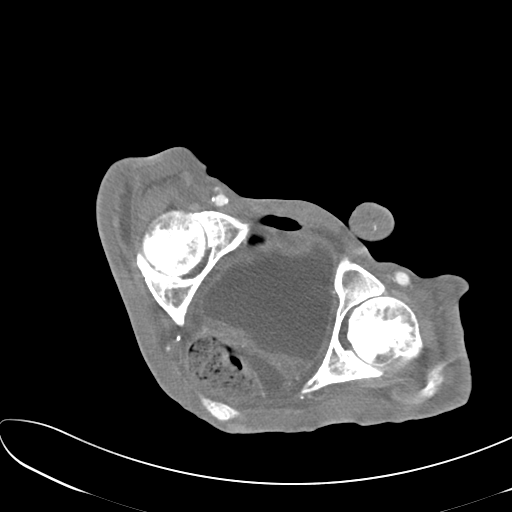
[im 27/90  soft-tissue]
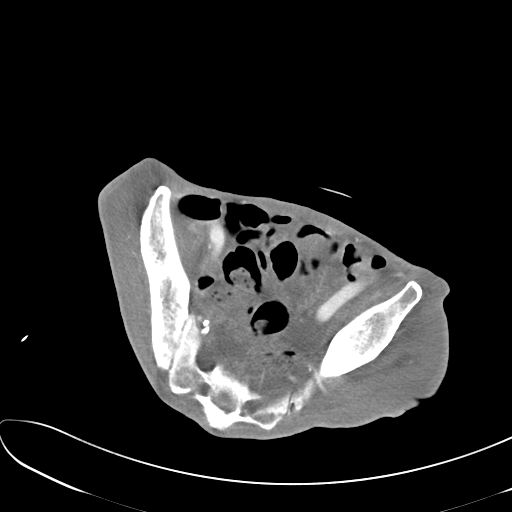
[im 32/90  soft-tissue]
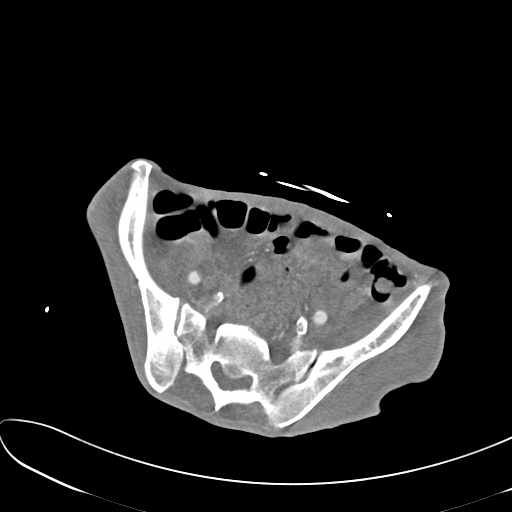
[im 41/90  soft-tissue]
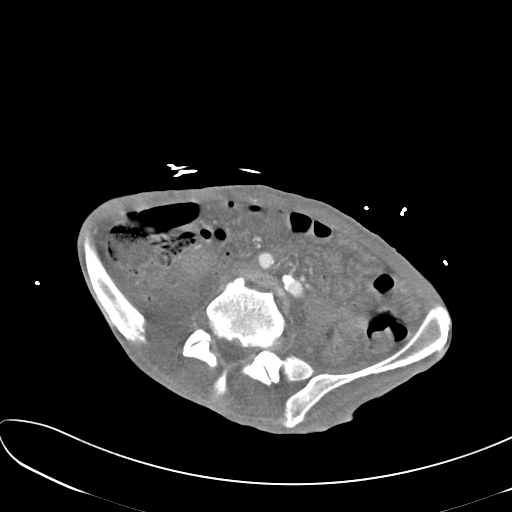
[im 45/90  soft-tissue]
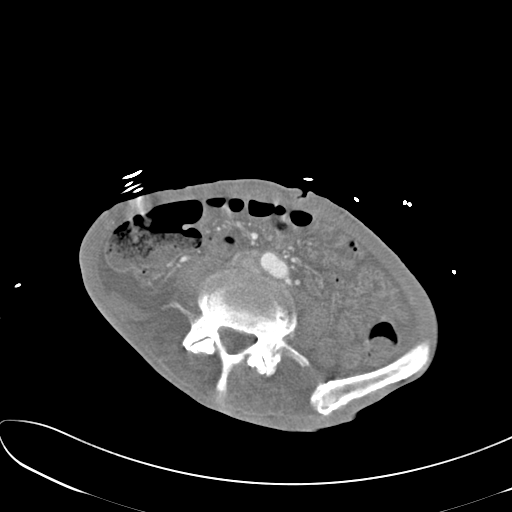
[im 49/90  soft-tissue]
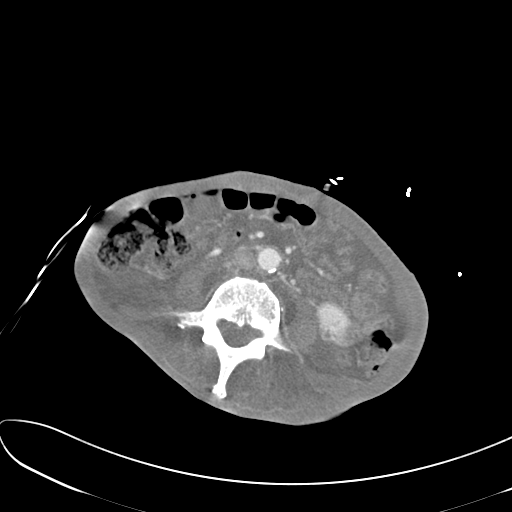
[im 58/90  soft-tissue]
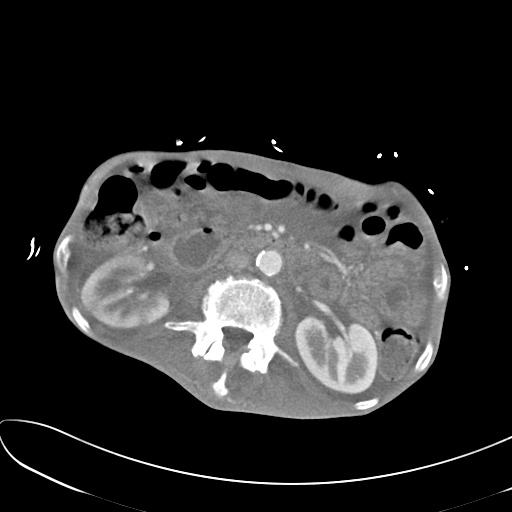
[im 58/90  bone]
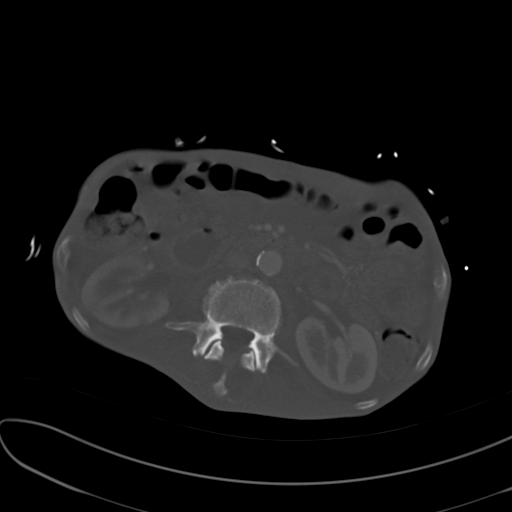
[im 63/90  soft-tissue]
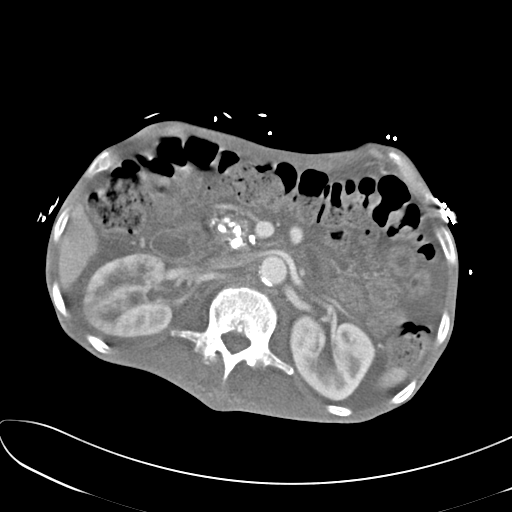
[im 72/90  soft-tissue]
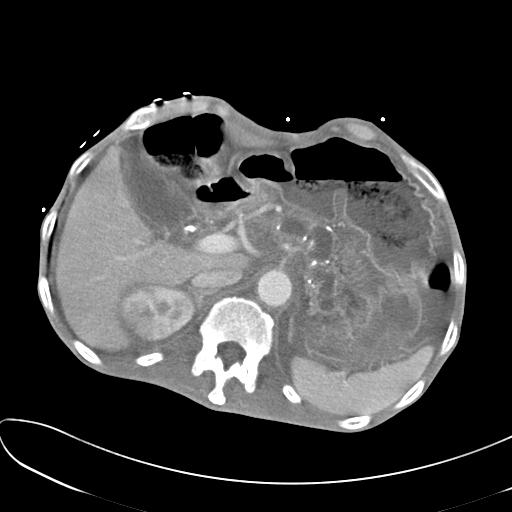
[im 72/90  lung]
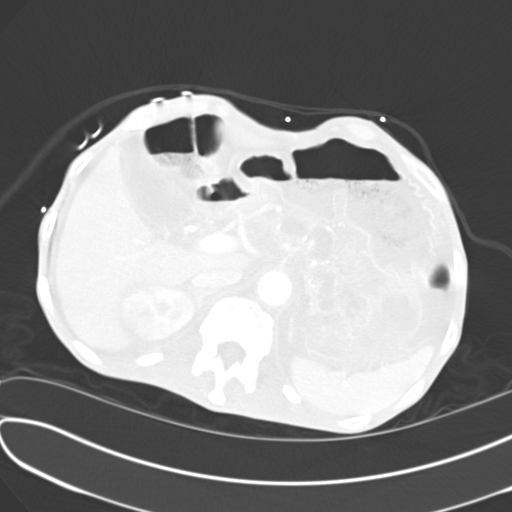
[im 76/90  soft-tissue]
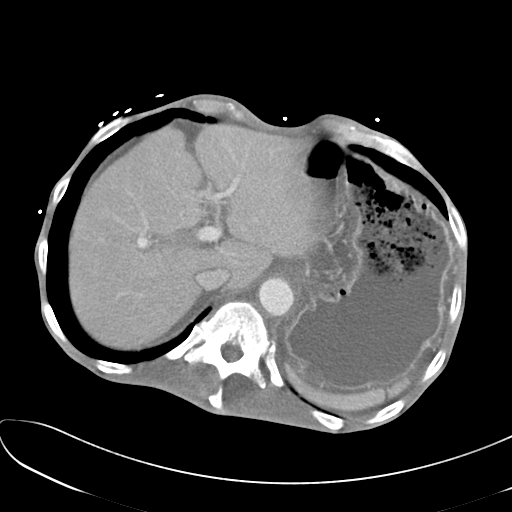
[im 76/90  lung]
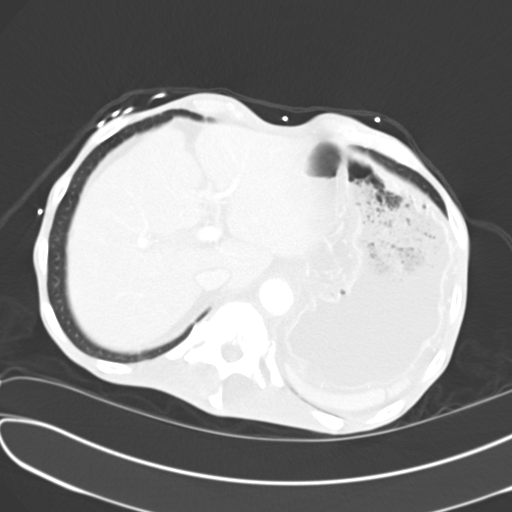
[im 81/90  lung]
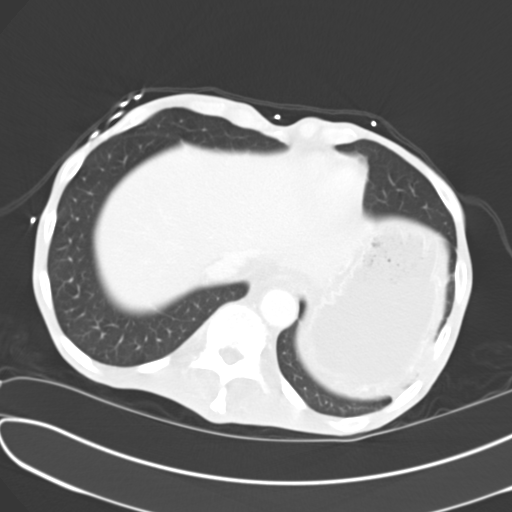
[im 85/90  soft-tissue]
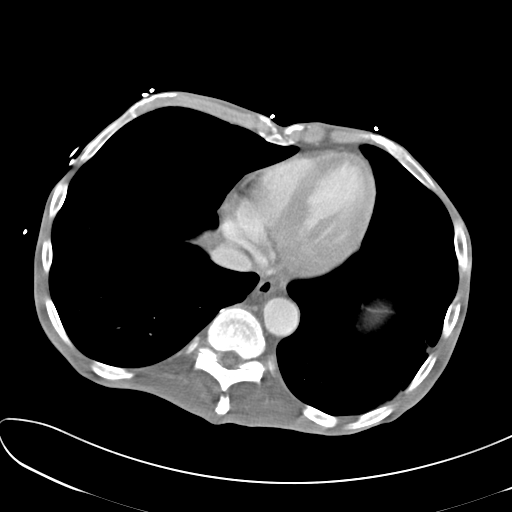
[im 85/90  lung]
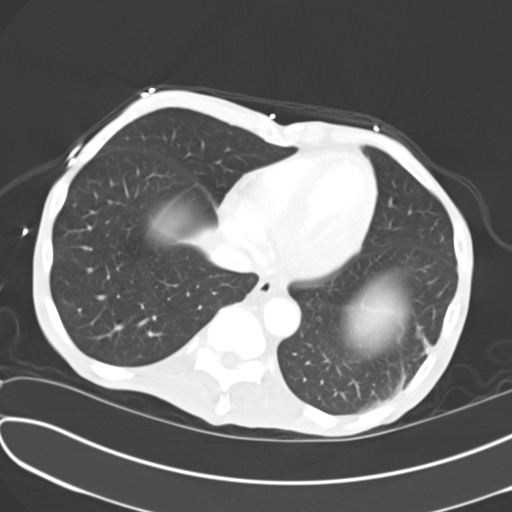

[14 of 32 positions shown; findings below may reference images not displayed]

FINDINGS: There is no pleural or pericardial effusion. Heart size is normal.
There is some mild atelectasis or scar in the left lung base.

The patient is cachectic. There is diffuse body wall and mesenteric
edema and some abdominal and pelvic ascites. No drainable collection
is seen.

A 0.6 cm nonobstructing stone is seen in the midpole of the right
kidney. Contrast excretion from the right kidney is delayed and
there are areas of cortical hypoattenuation on delayed imaging. No
obstructing stone is seen. The urinary bladder is unremarkable.

The spleen and adrenal glands appear normal. Chronic pancreatic
ductal dilatation with extensive calcifications throughout the
pancreas consistent with chronic pancreatitis appear unchanged.
There is a small low attenuating lesion in the left hepatic lobe on
image 12 is unchanged. The liver is otherwise unremarkable. The
gallbladder appears normal.

Small bowel loops are largely decompressed but otherwise
unremarkable. The colon and stomach appear normal. The appendix is
not visualized but no focal inflammatory process is seen. Aortoiliac
atherosclerosis without aneurysm is identified.

No focal bony abnormality is seen.
IMPRESSION: Cachectic appearing patient with diffuse body wall and mesenteric
edema consistent with anasarca.

Abnormal enhancement pattern right kidney is most worrisome for
pyelonephritis.

Nonobstructing stone midpole right kidney.

Chronic calcific pancreatitis.

Atherosclerosis.
